# Patient Record
Sex: Female | Born: 1985 | Race: White | Hispanic: No | Marital: Married | Smoking: Former smoker
Health system: Southern US, Community
[De-identification: ages and names within clinical notes are randomized; demographics above are authoritative.]

## PROBLEM LIST (undated history)

## (undated) DIAGNOSIS — R011 Cardiac murmur, unspecified: Secondary | ICD-10-CM

## (undated) DIAGNOSIS — K219 Gastro-esophageal reflux disease without esophagitis: Secondary | ICD-10-CM

## (undated) HISTORY — PX: NO PAST SURGERIES: SHX2092

---

## 2012-05-17 ENCOUNTER — Observation Stay: Payer: Self-pay | Admitting: Obstetrics & Gynecology

## 2012-06-08 ENCOUNTER — Inpatient Hospital Stay: Payer: Self-pay | Admitting: Obstetrics and Gynecology

## 2012-06-08 LAB — PIH PROFILE
Anion Gap: 9 (ref 7–16)
Calcium, Total: 8.8 mg/dL (ref 8.5–10.1)
Chloride: 106 mmol/L (ref 98–107)
Co2: 23 mmol/L (ref 21–32)
EGFR (African American): >60
HCT: 35.6 % (ref 35.0–47.0)
HGB: 11.8 g/dL — ABNORMAL LOW (ref 12.0–16.0)
MCHC: 33 g/dL (ref 32.0–36.0)
MCV: 89 fL (ref 80–100)
Osmolality: 274 (ref 275–301)
Platelet: 158 10*3/uL (ref 150–440)
Potassium: 4.6 mmol/L (ref 3.5–5.1)
RDW: 15.2 % — ABNORMAL HIGH (ref 11.5–14.5)
SGOT(AST): 36 U/L (ref 15–37)
Sodium: 138 mmol/L (ref 136–145)
WBC: 12 10*3/uL — ABNORMAL HIGH (ref 3.6–11.0)

## 2012-06-08 LAB — PROTEIN / CREATININE RATIO, URINE: Protein/Creat. Ratio: 226 mg/gCREAT — ABNORMAL HIGH (ref 0–200)

## 2012-06-09 LAB — HEMATOCRIT: HCT: 31 % — ABNORMAL LOW (ref 35.0–47.0)

## 2015-03-02 NOTE — H&P (Signed)
L&D Evaluation:  History Expanded:   HPI 29 yo G1P0 w estimated date of confinement 06/04/12 for leakage of fluid and abd pain.  Mild irreg contractions.  No vb. Prenatal Care at [facility].    Gravida 1    Term 0    PreTerm 0    Abortion 0    Living 0    Presents with abdominal pain    Patient's Medical History No Chronic Illness    Patient's Surgical History none    Medications Pre Natal Vitamins    Allergies NKDA    Social History none    Family History Non-Contributory   ROS:   ROS All systems were reviewed.  HEENT, CNS, GI, GU, Respiratory, CV, Renal and Musculoskeletal systems were found to be normal.   Exam:   Vital Signs stable    General no apparent distress    Mental Status clear    Chest clear    Heart normal sinus rhythm, no murmur/gallop/rubs    Abdomen gravid, non-tender    Estimated Fetal Weight Average for gestational age    Back no CVAT    Edema no edema    Pelvic no external lesions, FT/50/-3 high and ballotable    Mebranes Intact, Neg Nitrazine    FHT normal rate with no decels    Ucx irregular    Skin dry   Impression:   Impression Abd pain.  NO premature rupture of membranes.  No cervical change so not in labor yet.   Plan:   Plan EFM/NST, monitor contractions and for cervical change, fluids   Electronic Signatures: Letitia LibraHarris, Zan Triska Paul (MD)  (Signed 26-Jul-13 11:59)  Authored: L&D Evaluation   Last Updated: 26-Jul-13 11:59 by Letitia LibraHarris, Jovaughn Wojtaszek Paul (MD)

## 2015-03-02 NOTE — H&P (Signed)
L&D Evaluation:  History:   HPI 29 yo G1P0 [facility] for [facility] today presenting with contractions    Presents with abdominal pain    Patient's Medical History No Chronic Illness    Patient's Surgical History none    Medications Pre Natal Vitamins    Allergies NKDA    Social History none    Family History Non-Contributory   ROS:   ROS All systems were reviewed.  HEENT, CNS, GI, GU, Respiratory, CV, Renal and Musculoskeletal systems were found to be normal.   Exam:   Vital Signs BP >140/90  165/60    General no apparent distress    Abdomen gravid, non-tender    Estimated Fetal Weight Average for gestational age    Fetal Position vtx    Edema 2+    Pelvic no external lesions, 4/70/-3    Mebranes Intact    FHT normal rate with no decels    Ucx irregular    Skin dry   Impression:   Impression early labor, evaluation for PIH, IOL for today postdate, presents with contractions, cervical change since clinic this week, and elvated BP   Plan:   Plan EFM/NST, monitor contractions and for cervical change, monitor BP, PIH panel, fluids   Electronic Signatures: Lorrene ReidStaebler, Rozelia Catapano M (MD)  (Signed 17-Aug-13 04:59)  Authored: L&D Evaluation   Last Updated: 17-Aug-13 04:59 by Lorrene ReidStaebler, Chan Rosasco M (MD)

## 2016-10-11 LAB — HM PAP SMEAR

## 2017-01-02 ENCOUNTER — Ambulatory Visit (INDEPENDENT_AMBULATORY_CARE_PROVIDER_SITE_OTHER): Admitting: Obstetrics & Gynecology

## 2017-01-02 ENCOUNTER — Encounter: Payer: Self-pay | Admitting: Obstetrics & Gynecology

## 2017-01-02 VITALS — BP 130/98 | HR 74 | Wt 198.0 lb

## 2017-01-02 DIAGNOSIS — Z3490 Encounter for supervision of normal pregnancy, unspecified, unspecified trimester: Secondary | ICD-10-CM

## 2017-01-02 DIAGNOSIS — O99210 Obesity complicating pregnancy, unspecified trimester: Secondary | ICD-10-CM | POA: Insufficient documentation

## 2017-01-02 DIAGNOSIS — Z3A01 Less than 8 weeks gestation of pregnancy: Secondary | ICD-10-CM

## 2017-01-02 LAB — POCT URINE PREGNANCY: Preg Test, Ur: POSITIVE — AB

## 2017-01-02 MED ORDER — PROVIDA OB 20-20-1.25 MG PO CAPS: 1.0000 | ORAL_CAPSULE | Freq: Every day | ORAL | 11 refills | Status: AC

## 2017-01-02 NOTE — Addendum Note (Signed)
Addended by: Cornelius MorasPATTERSON, Kanye Depree D on: 01/02/2017 11:49 AM   Modules accepted: Orders

## 2017-01-02 NOTE — Patient Instructions (Signed)

## 2017-01-02 NOTE — Progress Notes (Signed)
01/02/2017   Chief Complaint: Missed period  History of Present Illness: Penny Jones is a 31 y.o. G2P1001 [redacted]w[redacted]d based on Patient's last menstrual period was 11/30/2016. with an Estimated Date of Delivery: 09/06/17, with the above CC. Preg complicated by none.  Her periods were: regular periods every 28 days She was using no method when she conceived.  She has Negative signs or symptoms of nausea/vomiting of pregnancy. She has Negative signs or symptoms of miscarriage or preterm labor She identifies Negative Zika risk factors for her and her partner On any different medications around the time she conceived/early pregnancy: No  History of varicella: Yes   ROS: A 12-point review of systems was performed and negative, except as stated in the above HPI.  OBGYN History: As per HPI. OB History  Gravida Para Term Preterm AB Living  2 1 1     1   SAB TAB Ectopic Multiple Live Births               # Outcome Date GA Lbr Len/2nd Weight Sex Delivery Anes PTL Lv  2 Current           1 Term 06/08/12 [redacted]w[redacted]d  7 lb 2.2 oz (3.239 kg) F Vag-Spont         Any issues with any prior pregnancies: no Any prior children are healthy, doing well, without any problems or issues: yes History of pap smears: Yes. Last pap smear 09/2016. Abnormal: no  History of STIs: No   Past Medical History: History reviewed. No pertinent past medical history.  Past Surgical History: History reviewed. No pertinent surgical history.  Family History:  History reviewed. No pertinent family history. She denies any female cancers, bleeding or blood clotting disorders.  She denies any history of mental retardation, birth defects or genetic disorders in her or the FOB's history  Social History:  Social History   Social History  . Marital status: Married    Spouse name: N/A  . Number of children: N/A  . Years of education: N/A   Occupational History  . Not on file.   Social History Main Topics  . Smoking status:  Never Smoker  . Smokeless tobacco: Never Used  . Alcohol use Yes  . Drug use: No  . Sexual activity: Yes    Birth control/ protection: None   Other Topics Concern  . Not on file   Social History Narrative  . No narrative on file   Any pets in the household: no  Allergy: Allergies not on file  Current Outpatient Medications:  Current Outpatient Prescriptions:  .  Prenat w/o A Vit-FeFum-FePo-FA (PROVIDA OB) 20-20-1.25 MG CAPS, Take 1 capsule by mouth daily., Disp: 30 capsule, Rfl: 11  Review of Systems  Constitutional: Negative for chills, fever and malaise/fatigue.  HENT: Negative for congestion, sinus pain and sore throat.   Eyes: Negative for blurred vision and pain.  Respiratory: Negative for cough and wheezing.   Cardiovascular: Negative for chest pain and leg swelling.  Gastrointestinal: Negative for abdominal pain, constipation, diarrhea, heartburn, nausea and vomiting.  Genitourinary: Negative for dysuria, frequency, hematuria and urgency.  Musculoskeletal: Negative for back pain, joint pain, myalgias and neck pain.  Skin: Negative for itching and rash.  Neurological: Negative for dizziness, tremors and weakness.  Endo/Heme/Allergies: Does not bruise/bleed easily.  Psychiatric/Behavioral: Negative for depression. The patient is not nervous/anxious and does not have insomnia.    Physical Exam:    BP (!) 130/98   Pulse 74   Wt  198 lb (89.8 kg)   LMP 11/30/2016  There is no height or weight on file to calculate BMI. Fundal height: not applicable FHTs: not yet  Constitutional: Well nourished, well developed female in no acute distress.  Neck:  Supple, normal appearance, and no thyromegaly  Cardiovascular: S1, S2 normal, no murmur, rub or gallop, regular rate and rhythm Respiratory:  Clear to auscultation bilateral. Normal respiratory effort Abdomen: positive bowel sounds and no masses, hernias; diffusely non tender to palpation, non distended Breasts: breasts  appear normal, no suspicious masses, no skin or nipple changes or axillary nodes. Neuro/Psych:  Normal mood and affect.  Skin:  Warm and dry.  Lymphatic:  No inguinal lymphadenopathy.   Pelvic exam: is not limited by body habitus EGBUS: within normal limits, Vagina: within normal limits and with no blood in the vault, Cervix: normal appearing cervix without discharge or lesions, closed/long/high, Uterus:  enlarged: slightly, and Adnexa:  normal adnexa  Assessment: Penny Jones is a 31 y.o. G2P1001 4564w5d based on Patient's last menstrual period was 11/30/2016. with an Estimated Date of Delivery: 09/06/17,  for prenatal care.  Plan: G2P1 4 weeks  Obesity risk factors discussed  PNV.  US nv.  Genetic testing to be discussed/offered nv.  The patient has not traveled to a [place] endemic area within the past 6 months, nor has she had unprotected sex with a partner who has travelled to a [place] endemic region within the past 6 months.The patient has been advised to notify us if these factors change any time during this current pregnancy, so adequate testing and monitoring can be initiated.  Problem list reviewed and updated.  Follow up in 4 weeks.  Annamarie MajorPaul Ashleen Demma, MD, Merlinda FrederickFACOG [facility], [facility] 01/02/2017  11:29 AM

## 2017-01-03 LAB — RPR+RH+ABO+RUB AB+AB SCR+CB...
ANTIBODY SCREEN: NEGATIVE
HEMOGLOBIN: 13 g/dL (ref 11.1–15.9)
HIV Screen 4th Generation wRfx: NONREACTIVE
Hematocrit: 38.6 % (ref 34.0–46.6)
Hepatitis B Surface Ag: NEGATIVE
MCH: 30.9 pg (ref 26.6–33.0)
MCHC: 33.7 g/dL (ref 31.5–35.7)
MCV: 92 fL (ref 79–97)
Platelets: 325 10*3/uL (ref 150–379)
RBC: 4.21 x10E6/uL (ref 3.77–5.28)
RDW: 13.6 % (ref 12.3–15.4)
RH TYPE: NEGATIVE
RPR Ser Ql: NONREACTIVE
RUBELLA: 2.39 {index} (ref >0.99)
Varicella zoster IgG: 2692 index (ref >165)
WBC: 7.9 10*3/uL (ref 3.4–10.8)

## 2017-01-04 LAB — URINE CULTURE: Organism ID, Bacteria: NO GROWTH

## 2017-01-04 LAB — GC/CHLAMYDIA PROBE AMP
Chlamydia trachomatis, NAA: NEGATIVE
NEISSERIA GONORRHOEAE BY PCR: NEGATIVE

## 2017-01-25 ENCOUNTER — Other Ambulatory Visit: Payer: Self-pay

## 2017-01-25 ENCOUNTER — Telehealth: Payer: Self-pay

## 2017-01-25 DIAGNOSIS — Z3490 Encounter for supervision of normal pregnancy, unspecified, unspecified trimester: Secondary | ICD-10-CM

## 2017-01-25 NOTE — Telephone Encounter (Signed)
Pt calling stated PH gave her a bag of samples of pnv with instructions to call c the one she likes best and a rx would be calling in.  Pt would like rx of Concept DHA sent in. [phone]

## 2017-01-26 MED ORDER — CONCEPT DHA 53.5-38-1 MG PO CAPS: 1.0000 | ORAL_CAPSULE | Freq: Every day | ORAL | 11 refills | Status: DC

## 2017-01-26 NOTE — Telephone Encounter (Signed)
Can you send this in to the pharmacy ?

## 2017-01-26 NOTE — Telephone Encounter (Signed)
Pt calling to check status of rx request from yesterday. States [facility] still doesn't have it.

## 2017-01-29 MED ORDER — CONCEPT DHA 53.5-38-1 MG PO CAPS: 1.0000 | ORAL_CAPSULE | Freq: Every day | ORAL | 11 refills | Status: AC

## 2017-01-29 NOTE — Telephone Encounter (Signed)
To [facility] it went

## 2017-01-29 NOTE — Addendum Note (Signed)
Addended by: Nadara Mustard on: 01/29/2017 02:55 PM   Modules accepted: Orders

## 2017-01-30 ENCOUNTER — Ambulatory Visit (INDEPENDENT_AMBULATORY_CARE_PROVIDER_SITE_OTHER): Admitting: Obstetrics & Gynecology

## 2017-01-30 ENCOUNTER — Ambulatory Visit (INDEPENDENT_AMBULATORY_CARE_PROVIDER_SITE_OTHER)

## 2017-01-30 VITALS — BP 100/70 | Wt 200.0 lb

## 2017-01-30 DIAGNOSIS — Z3A01 Less than 8 weeks gestation of pregnancy: Secondary | ICD-10-CM

## 2017-01-30 DIAGNOSIS — O99210 Obesity complicating pregnancy, unspecified trimester: Secondary | ICD-10-CM

## 2017-01-30 DIAGNOSIS — Z3687 Encounter for antenatal screening for uncertain dates: Secondary | ICD-10-CM | POA: Diagnosis not present

## 2017-01-30 DIAGNOSIS — Z3490 Encounter for supervision of normal pregnancy, unspecified, unspecified trimester: Secondary | ICD-10-CM

## 2017-01-30 NOTE — Progress Notes (Signed)
  Obstetric Problem Visit   Chief Complaint:  Chief Complaint  Patient presents with  . Routine Prenatal Visit    bleeding    History of Present Illness: Patient is a 31 y.o. G2P1001 [redacted]w[redacted]d presenting for first trimester bleeding.  The onset of bleeding was 9 days ago (light) then not again until today (brown).  Is bleeding equal to or greater than normal menstrual flow:  No Any recent trauma:  No Recent intercourse:  No History of prior miscarriage:  No Prior ultrasound demonstrating IUP:  Yes- today has GEST SAC but no fetal pole or yolk sac Prior ultrasound demonstrating viable IUP:  No Prior Serum HCG:  No  PMHx: She  has no past medical history on file. Also,  has no past surgical history on file., family history is not on file.,  reports that she has never smoked. She has never used smokeless tobacco. She reports that she drinks alcohol. She reports that she does not use drugs.  She has a current medication list which includes the following prescription(s): provida ob and concept dha. Also, has No Known Allergies.  ROS- negative except as above  Objective: Vitals:   01/30/17 1132  BP: 100/70   OBGyn Exam- deferred (see last visit)  Assessment: 31 y.o. G2P1001 [redacted]w[redacted]d 1. Obesity affecting pregnancy, antepartum  2. [redacted] weeks gestation of pregnancy Based on Gest Sac measurements today - Beta HCG, Quant today - US OB Comp Less 14 Wks; Future in 2 weeks (pending beta pattern) - Beta HCG, Quant; Future in 2 days  3. Small for gestational age - Beta HCG, Quant - US OB Comp Less 14 Wks; Future - Beta HCG, Quant; Future  1) First trimester bleeding - incidence and clinical course of first trimester bleeding is discussed in detail with the patient today.  Approximately 1/3 of pregnancies ending in live births experienced 1st trimester bleeding.  The amount of bleeding is variable and not necessarily predictive of outcome.  Sources may be cervical or uterine.  Subchorionic  hemorrhages are a frequent concurrent findings on ultrasound and are followed expectantly.  These often absorb or regress spontaneously although risk for expansion and further disruption of the utero-placental interface leading to miscarriage is possible.  There is no clearly documented benefit to limiting or modifying activity and sexual intercourse in altering clinic course of 1st trimester bleeding.    2) Routine bleeding precautions were discussed with the patient prior the conclusion of today's visit.  Annamarie Major, MD, [facility], [facility] 01/30/2017  12:00 PM

## 2017-01-31 ENCOUNTER — Telehealth: Payer: Self-pay | Admitting: Obstetrics & Gynecology

## 2017-01-31 LAB — BETA HCG QUANT (REF LAB): hCG Quant: 47364 m[IU]/mL

## 2017-01-31 NOTE — Telephone Encounter (Signed)
PT is calling to find out about her Labs result. Please advise [phone]

## 2017-01-31 NOTE — Progress Notes (Signed)
Hight level, so likely miscarriage and not mid-dated pregnancy.  Await second beta level in 48 hours.  LM with patient. Annamarie Major, MD, [facility] Frederick Ob/[facility], [facility] 01/31/2017  10:32 AM

## 2017-02-01 ENCOUNTER — Other Ambulatory Visit

## 2017-02-01 DIAGNOSIS — Z3A01 Less than 8 weeks gestation of pregnancy: Secondary | ICD-10-CM

## 2017-02-01 NOTE — Telephone Encounter (Signed)
please advise.

## 2017-02-02 ENCOUNTER — Ambulatory Visit (INDEPENDENT_AMBULATORY_CARE_PROVIDER_SITE_OTHER)

## 2017-02-02 ENCOUNTER — Ambulatory Visit (INDEPENDENT_AMBULATORY_CARE_PROVIDER_SITE_OTHER): Admitting: Obstetrics & Gynecology

## 2017-02-02 VITALS — BP 128/80 | Wt 202.0 lb

## 2017-02-02 DIAGNOSIS — O200 Threatened abortion: Secondary | ICD-10-CM

## 2017-02-02 DIAGNOSIS — O020 Blighted ovum and nonhydatidiform mole: Secondary | ICD-10-CM | POA: Diagnosis not present

## 2017-02-02 DIAGNOSIS — Z3A01 Less than 8 weeks gestation of pregnancy: Secondary | ICD-10-CM | POA: Diagnosis not present

## 2017-02-02 LAB — BETA HCG QUANT (REF LAB): hCG Quant: 51978 m[IU]/mL

## 2017-02-02 NOTE — Telephone Encounter (Signed)
Pt is schedule 02/02/17 with Tiburcio Pea

## 2017-02-02 NOTE — Telephone Encounter (Signed)
Will call w results

## 2017-02-02 NOTE — Telephone Encounter (Signed)
-----   Message from Nadara Mustard, MD sent at 02/02/2017  8:00 AM EDT ----- Add to 1030 OB US appt slot and then me to follow [facility]).  Pt knows to be here 1020.

## 2017-02-02 NOTE — Progress Notes (Signed)
  HPI: Threatened Abortion Patient at 65 and 1/[redacted] weeks gestation by LMP.  Patient reports one episode of dark blood Wed but no other bleeding; +breast T and nausea since Tues.  She is not in acute distress.  Ectopic risks include none. Cycle length is regular. Pregnancy testing: Beta 47,000 then 51,000. Pregnancy imaging: transvaginal ultrasound done on today. Result Gest sac increased in size c/w 7 weeks dating, no CRL or yolk sac.  Smaller SCH.Marland Kitchen  Blood type: A negative.  Other lab results: none.  Ultrasound demonstrates see above These findings are Pelvis abnormal blighted ovum vs mniscarriage vs early unseen pregnancy   PMHx: She  has no past medical history on file. Also,  has no past surgical history on file., family history is not on file.,  reports that she has never smoked. She has never used smokeless tobacco. She reports that she drinks alcohol. She reports that she does not use drugs.  She has a current medication list which includes the following prescription(s): concept dha. Also, has No Known Allergies.  Review of Systems  Constitutional: Negative for chills, fever and malaise/fatigue.  HENT: Negative for congestion, sinus pain and sore throat.   Eyes: Negative for blurred vision and pain.  Respiratory: Negative for cough and wheezing.   Cardiovascular: Negative for chest pain and leg swelling.  Gastrointestinal: Negative for abdominal pain, constipation, diarrhea, heartburn, nausea and vomiting.  Genitourinary: Negative for dysuria, frequency, hematuria and urgency.  Musculoskeletal: Negative for back pain, joint pain, myalgias and neck pain.  Skin: Negative for itching and rash.  Neurological: Negative for dizziness, tremors and weakness.  Endo/Heme/Allergies: Does not bruise/bleed easily.  Psychiatric/Behavioral: Negative for depression. The patient is not nervous/anxious and does not have insomnia.     Objective: BP 128/80   Wt 202 lb (91.6 kg)   LMP 11/30/2016    Physical examination Constitutional NAD, Conversant  Skin No rashes, lesions or ulceration.   Extremities: Moves all appropriately.  Normal ROM for age. No lymphadenopathy.  Neuro: Grossly intact  Psych: Oriented to PPT.  Normal mood. Normal affect.   Assessment:  Blighted ovum  [redacted] weeks gestation of pregnancy  Threatened abortion  Monitor sx's. Repeat US as scheduled, sooner if worsening bleeding or pain. Exp mgt vs D&C disucssed  Annamarie Major, MD, Merlinda Frederick Ob/Gyn, [facility] 02/02/2017  11:02 AM

## 2017-02-12 ENCOUNTER — Encounter: Admitting: Obstetrics & Gynecology

## 2017-02-12 ENCOUNTER — Other Ambulatory Visit: Payer: Self-pay | Admitting: Advanced Practice Midwife

## 2017-02-12 DIAGNOSIS — O021 Missed abortion: Secondary | ICD-10-CM

## 2017-02-13 ENCOUNTER — Ambulatory Visit (INDEPENDENT_AMBULATORY_CARE_PROVIDER_SITE_OTHER)

## 2017-02-13 ENCOUNTER — Ambulatory Visit (INDEPENDENT_AMBULATORY_CARE_PROVIDER_SITE_OTHER): Admitting: Advanced Practice Midwife

## 2017-02-13 VITALS — BP 118/78 | Wt 203.0 lb

## 2017-02-13 DIAGNOSIS — O200 Threatened abortion: Secondary | ICD-10-CM

## 2017-02-13 DIAGNOSIS — O021 Missed abortion: Secondary | ICD-10-CM | POA: Diagnosis not present

## 2017-02-13 DIAGNOSIS — O0289 Other abnormal products of conception: Secondary | ICD-10-CM

## 2017-02-13 NOTE — Progress Notes (Signed)
u/s done today

## 2017-02-14 LAB — BETA HCG QUANT (REF LAB): hCG Quant: 23877 m[IU]/mL

## 2017-02-14 NOTE — Patient Instructions (Signed)

## 2017-02-14 NOTE — Progress Notes (Addendum)
Had dating scan done today. No fetal pole and no cardiac activity. Beta Hcg done and is trending down from 47K to 51K last week and down to 23K today. Discussion of options for treatment including expectant management, medication, D&C. Pt is uncertain at this time. She plans to consider the options and let us know. Pt is reminded to have Rhogam injection.

## 2017-02-26 ENCOUNTER — Telehealth: Payer: Self-pay

## 2017-02-26 NOTE — Telephone Encounter (Signed)
Pt calling PH to let him know what she has decided.   Please call [phone]

## 2017-02-26 NOTE — Telephone Encounter (Signed)
Patient would like to speak to Dr. Tiburcio PeaHarris regarding miscarriage. States she has not had any bleeding or cramping. She does not want to have a D&C but would like advice on how long to wait to see if anything happens naturally or when to start medication. Please advise. cb# [phone] thank you

## 2017-02-28 ENCOUNTER — Ambulatory Visit: Admitting: Obstetrics & Gynecology

## 2017-02-28 ENCOUNTER — Encounter: Payer: Self-pay | Admitting: Obstetrics & Gynecology

## 2017-02-28 ENCOUNTER — Encounter
Admission: RE | Admit: 2017-02-28 | Discharge: 2017-02-28 | Disposition: A | Discharge status: Home / Self Care | Source: Ambulatory Visit | Attending: Obstetrics & Gynecology | Admitting: Obstetrics & Gynecology

## 2017-02-28 ENCOUNTER — Ambulatory Visit (INDEPENDENT_AMBULATORY_CARE_PROVIDER_SITE_OTHER): Admitting: Obstetrics & Gynecology

## 2017-02-28 VITALS — BP 118/80 | HR 80 | Ht 62.0 in | Wt 203.0 lb

## 2017-02-28 DIAGNOSIS — O021 Missed abortion: Secondary | ICD-10-CM

## 2017-02-28 HISTORY — DX: Cardiac murmur, unspecified: R01.1

## 2017-02-28 HISTORY — DX: Gastro-esophageal reflux disease without esophagitis: K21.9

## 2017-02-28 LAB — BETA HCG QUANT (REF LAB): HCG QUANT: 2784 m[IU]/mL

## 2017-02-28 NOTE — Telephone Encounter (Signed)
Pt calling again today to discuss with RPH what she has decided to do and if she wants to do the procedure was told she would need an appt. Pt would [facility] to [facility] if possible but knows that she would need to fill out paperwork and be seen for H&P. Pt is very nervous and anxious about having surgery but also wants to resolve miscarriage. Please advise if you would like to see patient this afternoon if your schedule allows. Thank you.

## 2017-02-28 NOTE — Progress Notes (Signed)
Obstetric Problem Visit   Chief Complaint:  Miscarriage  History of Present Illness: Patient is a 31 y.o. G2P1001 1657w6d presenting for first trimester bleeding and pain that has somewhat resolved; yet has beta hCG levels and US findings c/w missed abortion that has not passed.  Is bleeding equal to or greater than normal menstrual flow:  No Any recent trauma:  No Recent intercourse:  No History of prior miscarriage:  No Prior ultrasound demonstrating IUP:  Yes Prior ultrasound demonstrating viable IUP:  No Prior Serum HCG:  Yes Rh status: NEG  PMHx: She  has no past medical history on file. Also,  has no past surgical history on file., family history is not on file.,  reports that she has never smoked. She has never used smokeless tobacco. She reports that she drinks alcohol. She reports that she does not use drugs.  She has a current medication list which includes the following prescription(s): concept dha. Also, has No Known Allergies.  Review of Systems  Constitutional: Negative for chills, fever and malaise/fatigue.  HENT: Negative for congestion, sinus pain and sore throat.   Eyes: Negative for blurred vision and pain.  Respiratory: Negative for cough and wheezing.   Cardiovascular: Negative for chest pain and leg swelling.  Gastrointestinal: Negative for abdominal pain, constipation, diarrhea, heartburn, nausea and vomiting.  Genitourinary: Negative for dysuria, frequency, hematuria and urgency.  Musculoskeletal: Negative for back pain, joint pain, myalgias and neck pain.  Skin: Negative for itching and rash.  Neurological: Negative for dizziness, tremors and weakness.  Endo/Heme/Allergies: Does not bruise/bleed easily.  Psychiatric/Behavioral: Negative for depression. The patient is not nervous/anxious and does not have insomnia.     Objective: Vitals:   02/28/17 1302  BP: 118/80  Pulse: 80   Physical Exam  Constitutional: She is oriented to person, place, and time.  She appears well-developed and well-nourished. No distress.  Genitourinary: Rectum normal, vagina normal and uterus normal. Pelvic exam was performed with patient supine. There is no rash or lesion on the right labia. There is no rash or lesion on the left labia. Vagina exhibits no lesion. No bleeding in the vagina. Right adnexum does not display mass and does not display tenderness. Left adnexum does not display mass and does not display tenderness. Cervix does not exhibit motion tenderness, lesion, friability or polyp.   Uterus is mobile and midaxial. Uterus is not enlarged or exhibiting a mass.  HENT:  Head: Normocephalic and atraumatic. Head is without laceration.  Right Ear: Hearing normal.  Left Ear: Hearing normal.  Nose: No epistaxis.  No foreign bodies.  Mouth/Throat: Uvula is midline, oropharynx is clear and moist and mucous membranes are normal.  Eyes: Pupils are equal, round, and reactive to light.  Neck: Normal range of motion. Neck supple. No thyromegaly present.  Cardiovascular: Normal rate and regular rhythm.  Exam reveals no gallop and no friction rub.   No murmur heard. Pulmonary/Chest: Effort normal and breath sounds normal. No respiratory distress. She has no wheezes. Right breast exhibits no mass, no skin change and no tenderness. Left breast exhibits no mass, no skin change and no tenderness.  Abdominal: Soft. Bowel sounds are normal. She exhibits no distension. There is no tenderness. There is no rebound.  Musculoskeletal: Normal range of motion.  Neurological: She is alert and oriented to person, place, and time. No cranial nerve deficit.  Skin: Skin is warm and dry.  Psychiatric: She has a normal mood and affect. Judgment normal.  Vitals reviewed.  Assessment: 31 y.o. G2P1001 [redacted]w[redacted]d 1. Missed abortion - Beta HCG, Quant -D&C as it has been a few weeks without spontaneous resolution  Plan: Problem List Items Addressed This Visit    None    Visit Diagnoses     Missed abortion    -  Primary   Relevant Orders   Beta HCG, Quant      1) Plan D&C due to lack of spontaneous resolution  2) I have had a careful discussion with this patient about all the options available and the risk/benefits of each. I have fully informed this patient that surgery may subject her to a variety of discomforts and risks: She understands that most patients have surgery with little difficulty, but problems can happen ranging from minor to fatal. These include nausea, vomiting, pain, bleeding, infection, poor healing, hernia, or formation of adhesions. Unexpected reactions may occur from any drug or anesthetic given. Unintended injury may occur to other pelvic or abdominal structures such as Fallopian tubes, ovaries, bladder, ureter (tube from kidney to bladder), or bowel. Nerves going from the pelvis to the legs may be injured. Any such injury may require immediate or later additional surgery to correct the problem. Excessive blood loss requiring transfusion is very unlikely but possible. Dangerous blood clots may form in the legs or lungs. Physical and sexual activity will be restricted in varying degrees for an indeterminate period of time but most often 2-6 weeks.  Finally, she understands that it is impossible to list every possible undesirable effect and that the condition for which surgery is done is not always cured or significantly improved, and in rare cases may be even worse.Ample time was given to answer all questions.  3) The patient is Rh NEG, rhogam is therefore  indicated to decrease the risk rhesus alloimmunization.  Will give in hospital.  4) Routine bleeding precautions were discussed with the patient prior the conclusion of today's visit.  Annamarie Major, MD, [facility] Frederick Ob/G[facility], [facility] 02/28/2017  1:26 PM

## 2017-02-28 NOTE — Telephone Encounter (Signed)
Sch appt today 1:00 for me to preop.  Then forward this to Cohen to schedule surgery.  Surgery Booking Request Patient Full Name: Penny Jones  MRN: [id number]  DOB: 1986/07/08  Surgeon: Letitia Libraobert Paul Harris, MD  Requested Surgery Date and Time: 03/02/17 Primary Diagnosis and Code: Missed Abortion Secondary Diagnosis and Code: none Surgical Procedure: Suction D&C L&D Notification: No Admission Status: same day surgery Length of Surgery: 20 Special Case Needs: no H&P: 5/9 (date) Phone Interview or Office Pre-Admit: yes Interpreter: no Language: e Medical Clearance: no Special Scheduling Instructions: no

## 2017-02-28 NOTE — Patient Instructions (Signed)
  Your procedure is scheduled on: 03-01-17 Report to Same Day Surgery 2nd floor medical mall [facility](Medical Mall Entrance-take elevator on left to 2nd floor.  Check in with surgery information desk.) @ 12:30 PM   Remember: Instructions that are not followed completely may result in serious medical risk, up to and including death, or upon the discretion of your surgeon and anesthesiologist your surgery may need to be rescheduled.    _x___ 1. Do not eat food or drink liquids after midnight. No gum chewing or                              hard candies.     __x__ 2. No Alcohol for 24 hours before or after surgery.   __x__3. No Smoking for 24 prior to surgery.   ____  4. Bring all medications with you on the day of surgery if instructed.    __x__ 5. Notify your doctor if there is any change in your medical condition     (cold, fever, infections).     Do not wear jewelry, make-up, hairpins, clips or nail polish.  Do not wear lotions, powders, or perfumes. You may wear deodorant.  Do not shave 48 hours prior to surgery. Men may shave face and neck.  Do not bring valuables to the hospital.    [facility] belongings or valuables.               Contacts, dentures or bridgework may not be worn into surgery.  Leave your suitcase in the car. After surgery it may be brought to your room.  For patients admitted to the hospital, discharge time is determined by your                       treatment team.   Patients discharged the day of surgery will not be allowed to drive home.  You will need someone to drive you home and stay with you the night of your procedure.    Please read over the following fact sheets that you were given:   [facility] and or MRSA Information   ____ Take anti-hypertensive (unless it includes a diuretic), cardiac, seizure, asthma,     anti-reflux and psychiatric medicines. These include:  1. NONE  2.  3.  4.  5.  6.  ____Fleets  enema or Magnesium Citrate as directed.   ____ Use CHG Soap or sage wipes as directed on instruction sheet   ____ Use inhalers on the day of surgery and bring to hospital day of surgery  ____ Stop Metformin and Janumet 2 days prior to surgery.    ____ Take 1/2 of usual insulin dose the night before surgery and none on the morning     surgery.   ____ Follow recommendations from Cardiologist, Pulmonologist or PCP regarding          stopping Aspirin, Coumadin, Pllavix ,Eliquis, Effient, or Pradaxa, and Pletal.  X____Stop Anti-inflammatories such as Advil, Aleve, Ibuprofen, Motrin, Naproxen, Naprosyn, Goodies powders or aspirin products NOWOK to take Tylenol   ____ Stop supplements until after surgery.  But may continue Vitamin D, Vitamin B,       and multivitamin.   ____ Bring C-Pap to the hospital.

## 2017-02-28 NOTE — Telephone Encounter (Signed)
Pt aware of appt for this afternoon.   Harriett SineNancy please see below for surgery scheduling. Thank you.

## 2017-02-28 NOTE — Patient Instructions (Signed)
Dilation and Curettage or Vacuum Curettage Dilation and curettage (D&C) and vacuum curettage are minor procedures. A D&C involves stretching (dilation) the cervix and scraping (curettage) the inside lining of the uterus (endometrium). During a D&C, tissue is gently scraped from the endometrium, starting from the top portion of the uterus down to the lowest part of the uterus (cervix). During a vacuum curettage, the lining and tissue in the uterus are removed with the use of gentle suction. Curettage may be performed to either diagnose or treat a problem. As a diagnostic procedure, curettage is performed to examine tissues from the uterus. A diagnostic curettage may be done if you have:  Irregular bleeding in the uterus.  Bleeding with the development of clots.  Spotting between menstrual periods.  Prolonged menstrual periods or other abnormal bleeding.  Bleeding after menopause.  No menstrual period (amenorrhea).  A change in size and shape of the uterus.  Abnormal endometrial cells discovered during a Pap test. As a treatment procedure, curettage may be performed for the following reasons:  Removal of an IUD (intrauterine device).  Removal of retained placenta after giving birth.  Abortion.  Miscarriage.  Removal of endometrial polyps.  Removal of uncommon types of noncancerous lumps (fibroids). Tell a health care provider about:  Any allergies you have, including allergies to prescribed medicine or latex.  All medicines you are taking, including vitamins, herbs, eye drops, creams, and over-the-counter medicines. This is especially important if you take any blood-thinning medicine. Bring a list of all of your medicines to your appointment.  Any problems you or family members have had with anesthetic medicines.  Any blood disorders you have.  Any surgeries you have had.  Your medical history and any medical conditions you have.  Whether you are pregnant or may be  pregnant.  Recent vaginal infections you have had.  Recent menstrual periods, bleeding problems you have had, and what form of birth control (contraception) you use. What are the risks? Generally, this is a safe procedure. However, problems may occur, including:  Infection.  Heavy vaginal bleeding.  Allergic reactions to medicines.  Damage to the cervix or other structures or organs.  Development of scar tissue (adhesions) inside the uterus, which can cause abnormal amounts of menstrual bleeding. This may make it harder to get pregnant in the future.  A hole (perforation) or puncture in the uterine wall. This is rare. What happens before the procedure? Staying hydrated  Follow instructions from your health care provider about hydration, which may include:  Up to 2 hours before the procedure - you may continue to drink clear liquids, such as water, clear fruit juice, black coffee, and plain tea. Eating and drinking restrictions  Follow instructions from your health care provider about eating and drinking, which may include:  8 hours before the procedure - stop eating heavy meals or foods such as meat, fried foods, or fatty foods.  6 hours before the procedure - stop eating light meals or foods, such as toast or cereal.  6 hours before the procedure - stop drinking milk or drinks that contain milk.  2 hours before the procedure - stop drinking clear liquids. If your health care provider told you to take your medicine(s) on the day of your procedure, take them with only a sip of water. Medicines   Ask your health care provider about:  Changing or stopping your regular medicines. This is especially important if you are taking diabetes medicines or blood thinners.  Taking medicines such   as aspirin and ibuprofen. These medicines can thin your blood. Do not take these medicines before your procedure if your health care provider instructs you not to.  You may be given antibiotic  medicine to help prevent infection. General instructions   For 24 hours before your procedure, do not:  Douche.  Use tampons.  Use medicines, creams, or suppositories in the vagina.  Have sexual intercourse.  You may be given a pregnancy test on the day of the procedure.  Plan to have someone take you home from the hospital or clinic.  You may have a blood or urine sample taken.  If you will be going home right after the procedure, plan to have someone with you for 24 hours. What happens during the procedure?  To reduce your risk of infection:  Your health care team will wash or sanitize their hands.  Your skin will be washed with soap.  An IV tube will be inserted into one of your veins.  You will be given one of the following:  A medicine that numbs the area in and around the cervix (local anesthetic).  A medicine to make you fall asleep (general anesthetic).  You will lie down on your back, with your feet in foot rests (stirrups).  The size and position of your uterus will be checked.  A lubricated instrument (speculum or Sims retractor) will be inserted into the back side of your vagina. The speculum will be used to hold apart the walls of your vagina so your health care provider can see your cervix.  A tool (tenaculum) will be attached to the lip of the cervix to stabilize it.  Your cervix will be softened and dilated. This may be done by:  Taking a medicine.  Having tapered dilators or thin rods (laminaria) or gradual widening instruments (tapered dilators) inserted into your cervix.  A small, sharp, curved instrument (curette) will be used to scrape a small amount of tissue or cells from the endometrium or cervical canal. In some cases, gentle suction is applied with the curette. The curette will then be removed. The cells will be taken to a lab for testing. The procedure may vary among health care providers and hospitals. What happens after the  procedure?  You may have mild cramping, backache, pain, and light bleeding or spotting. You may pass small blood clots from your vagina.  You may have to wear compression stockings. These stockings help to prevent blood clots and reduce swelling in your legs.  Your blood pressure, heart rate, breathing rate, and blood oxygen level will be monitored until the medicines you were given have worn off. Summary  Dilation and curettage (D&C) involves stretching (dilation) the cervix and scraping (curettage) the inside lining of the uterus (endometrium).  After the procedure, you may have mild cramping, backache, pain, and light bleeding or spotting. You may pass small blood clots from your vagina.  Plan to have someone take you home from the hospital or clinic. This information is not intended to replace advice given to you by your health care provider. Make sure you discuss any questions you have with your health care provider. Document Released: 10/09/2005 Document Revised: 06/25/2016 Document Reviewed: 06/25/2016 Elsevier Interactive Patient Education  2017 Elsevier Inc.  

## 2017-03-01 ENCOUNTER — Ambulatory Visit: Admitting: Anesthesiology

## 2017-03-01 ENCOUNTER — Encounter
Admission: RE | Disposition: A | Discharge status: Home / Self Care | Payer: Self-pay | Source: Ambulatory Visit | Attending: Obstetrics & Gynecology

## 2017-03-01 ENCOUNTER — Inpatient Hospital Stay: Admission: RE | Admit: 2017-03-01 | Source: Ambulatory Visit

## 2017-03-01 ENCOUNTER — Encounter: Payer: Self-pay | Admitting: Anesthesiology

## 2017-03-01 ENCOUNTER — Ambulatory Visit
Admission: RE | Admit: 2017-03-01 | Discharge: 2017-03-01 | Disposition: A | Discharge status: Home / Self Care | Source: Ambulatory Visit | Attending: Obstetrics & Gynecology | Admitting: Obstetrics & Gynecology

## 2017-03-01 DIAGNOSIS — O021 Missed abortion: Secondary | ICD-10-CM | POA: Diagnosis present

## 2017-03-01 DIAGNOSIS — Z87891 Personal history of nicotine dependence: Secondary | ICD-10-CM | POA: Diagnosis not present

## 2017-03-01 DIAGNOSIS — K219 Gastro-esophageal reflux disease without esophagitis: Secondary | ICD-10-CM | POA: Diagnosis not present

## 2017-03-01 HISTORY — PX: DILATION AND EVACUATION: SHX1459

## 2017-03-01 LAB — TYPE AND SCREEN
ABO/RH(D): A NEG
ANTIBODY SCREEN: NEGATIVE

## 2017-03-01 LAB — CBC
HEMATOCRIT: 36.8 % (ref 35.0–47.0)
HEMOGLOBIN: 12.6 g/dL (ref 12.0–16.0)
MCH: 30.6 pg (ref 26.0–34.0)
MCHC: 34.2 g/dL (ref 32.0–36.0)
MCV: 89.5 fL (ref 80.0–100.0)
Platelets: 240 10*3/uL (ref 150–440)
RBC: 4.11 MIL/uL (ref 3.80–5.20)
RDW: 13 % (ref 11.5–14.5)
WBC: 7.7 10*3/uL (ref 3.6–11.0)

## 2017-03-01 LAB — ABO/RH: ABO/RH(D): A NEG

## 2017-03-01 SURGERY — DILATION AND EVACUATION, UTERUS
Anesthesia: General | Site: Vagina | Wound class: Clean Contaminated

## 2017-03-01 MED ORDER — ACETAMINOPHEN NICU IV SYRINGE 10 MG/ML
INTRAVENOUS | Status: AC
  Filled 2017-03-01: qty 1

## 2017-03-01 MED ORDER — ONDANSETRON HCL 4 MG/2ML IJ SOLN: 4.0000 mg | Freq: Once | INTRAMUSCULAR | Status: DC | PRN

## 2017-03-01 MED ORDER — RHO D IMMUNE GLOBULIN 1500 UNIT/2ML IJ SOSY
300.0000 ug | PREFILLED_SYRINGE | Freq: Once | INTRAMUSCULAR | Status: AC
  Administered 2017-03-01: 300 ug via INTRAMUSCULAR
  Filled 2017-03-01: qty 2

## 2017-03-01 MED ORDER — KETOROLAC TROMETHAMINE 30 MG/ML IJ SOLN: 30.0000 mg | Freq: Four times a day (QID) | INTRAMUSCULAR | Status: DC

## 2017-03-01 MED ORDER — MORPHINE SULFATE (PF) 4 MG/ML IV SOLN: 1.0000 mg | INTRAVENOUS | Status: DC | PRN

## 2017-03-01 MED ORDER — MIDAZOLAM HCL 2 MG/2ML IJ SOLN
INTRAMUSCULAR | Status: DC | PRN
  Administered 2017-03-01: 2 mg via INTRAVENOUS

## 2017-03-01 MED ORDER — FENTANYL CITRATE (PF) 100 MCG/2ML IJ SOLN
INTRAMUSCULAR | Status: DC | PRN
  Administered 2017-03-01: 50 ug via INTRAVENOUS

## 2017-03-01 MED ORDER — ONDANSETRON HCL 4 MG/2ML IJ SOLN
INTRAMUSCULAR | Status: AC
  Filled 2017-03-01: qty 4

## 2017-03-01 MED ORDER — DOXYCYCLINE HYCLATE 100 MG PO CAPS: 100.0000 mg | ORAL_CAPSULE | Freq: Two times a day (BID) | ORAL | 0 refills | Status: DC

## 2017-03-01 MED ORDER — FENTANYL CITRATE (PF) 100 MCG/2ML IJ SOLN
INTRAMUSCULAR | Status: AC
  Filled 2017-03-01: qty 2

## 2017-03-01 MED ORDER — FAMOTIDINE 20 MG PO TABS
20.0000 mg | ORAL_TABLET | Freq: Once | ORAL | Status: AC
  Administered 2017-03-01: 20 mg via ORAL

## 2017-03-01 MED ORDER — LACTATED RINGERS IV SOLN
INTRAVENOUS | Status: DC
  Administered 2017-03-01: 12:00:00 via INTRAVENOUS

## 2017-03-01 MED ORDER — GLYCOPYRROLATE 0.2 MG/ML IJ SOLN
INTRAMUSCULAR | Status: AC
  Filled 2017-03-01: qty 5

## 2017-03-01 MED ORDER — ONDANSETRON HCL 4 MG/2ML IJ SOLN
INTRAMUSCULAR | Status: DC | PRN
  Administered 2017-03-01: 4 mg via INTRAVENOUS

## 2017-03-01 MED ORDER — LIDOCAINE HCL (CARDIAC) 20 MG/ML IV SOLN
INTRAVENOUS | Status: DC | PRN
  Administered 2017-03-01: 100 mg via INTRAVENOUS

## 2017-03-01 MED ORDER — PROPOFOL 10 MG/ML IV BOLUS
INTRAVENOUS | Status: DC | PRN
  Administered 2017-03-01: 30 mg via INTRAVENOUS
  Administered 2017-03-01: 80 mg via INTRAVENOUS
  Administered 2017-03-01: 50 mg via INTRAVENOUS
  Administered 2017-03-01: 150 mg via INTRAVENOUS
  Administered 2017-03-01: 50 mg via INTRAVENOUS

## 2017-03-01 MED ORDER — METHYLERGONOVINE MALEATE 0.2 MG PO TABS: 0.2000 mg | ORAL_TABLET | Freq: Four times a day (QID) | ORAL | 0 refills | Status: DC

## 2017-03-01 MED ORDER — DEXAMETHASONE SODIUM PHOSPHATE 10 MG/ML IJ SOLN
INTRAMUSCULAR | Status: AC
  Filled 2017-03-01: qty 2

## 2017-03-01 MED ORDER — IBUPROFEN 400 MG PO TABS: 400.0000 mg | ORAL_TABLET | Freq: Four times a day (QID) | ORAL | 0 refills | Status: DC | PRN

## 2017-03-01 MED ORDER — FENTANYL CITRATE (PF) 100 MCG/2ML IJ SOLN: 25.0000 ug | INTRAMUSCULAR | Status: DC | PRN

## 2017-03-01 MED ORDER — PROPOFOL 10 MG/ML IV BOLUS
INTRAVENOUS | Status: AC
  Filled 2017-03-01: qty 20

## 2017-03-01 MED ORDER — ACETAMINOPHEN 325 MG PO TABS: 650.0000 mg | ORAL_TABLET | ORAL | Status: DC | PRN

## 2017-03-01 MED ORDER — ACETAMINOPHEN 650 MG RE SUPP: 650.0000 mg | RECTAL | Status: DC | PRN

## 2017-03-01 MED ORDER — MIDAZOLAM HCL 2 MG/2ML IJ SOLN
INTRAMUSCULAR | Status: AC
  Filled 2017-03-01: qty 2

## 2017-03-01 MED ORDER — FAMOTIDINE 20 MG PO TABS
ORAL_TABLET | ORAL | Status: AC
  Administered 2017-03-01: 20 mg via ORAL
  Filled 2017-03-01: qty 1

## 2017-03-01 SURGICAL SUPPLY — 22 items
BAG COUNTER SPONGE EZ (MISCELLANEOUS) ×2 IMPLANT
CANISTER SUC SOCK COL 7IN (MISCELLANEOUS) ×3 IMPLANT
CATH ROBINSON RED A/P 16FR (CATHETERS) ×3 IMPLANT
COUNTER SPONGE BAG EZ (MISCELLANEOUS) ×1
FILTER UTR ASPR SPEC (MISCELLANEOUS) ×1 IMPLANT
FLTR UTR ASPR SPEC (MISCELLANEOUS) ×3
GLOVE BIO SURGEON STRL SZ8 (GLOVE) ×3 IMPLANT
GOWN STRL REUS W/ TWL LRG LVL3 (GOWN DISPOSABLE) ×1 IMPLANT
GOWN STRL REUS W/ TWL XL LVL3 (GOWN DISPOSABLE) ×1 IMPLANT
GOWN STRL REUS W/TWL LRG LVL3 (GOWN DISPOSABLE) ×2
GOWN STRL REUS W/TWL XL LVL3 (GOWN DISPOSABLE) ×2
KIT BERKELEY 1ST TRIMESTER 3/8 (MISCELLANEOUS) ×3 IMPLANT
KIT RM TURNOVER CYSTO AR (KITS) ×3 IMPLANT
NS IRRIG 500ML POUR BTL (IV SOLUTION) ×3 IMPLANT
PACK DNC HYST (MISCELLANEOUS) ×3 IMPLANT
PAD OB MATERNITY 4.3X12.25 (PERSONAL CARE ITEMS) ×3 IMPLANT
PAD PREP 24X41 OB/GYN DISP (PERSONAL CARE ITEMS) ×3 IMPLANT
SET BERKELEY SUCTION TUBING (SUCTIONS) ×3 IMPLANT
TOWEL OR 17X26 4PK STRL BLUE (TOWEL DISPOSABLE) ×3 IMPLANT
VACURETTE 10 RIGID CVD (CANNULA) IMPLANT
VACURETTE 12 RIGID CVD (CANNULA) IMPLANT
VACURETTE 8 RIGID CVD (CANNULA) ×3 IMPLANT

## 2017-03-01 NOTE — Transfer of Care (Signed)
Immediate Anesthesia Transfer of Care Note  Patient: Penny Jones  Procedure(s) Performed: Procedure(s): DILATATION AND EVACUATION (N/A)  Patient Location: PACU  Anesthesia Type:General  Level of Consciousness: awake  Airway & Oxygen Therapy: Patient Spontanous Breathing  Post-op Assessment: Report given to RN  Post vital signs: stable  Last Vitals:  Vitals:   03/01/17 1139 03/01/17 1305  BP: 128/83 124/75  Pulse: 78 95  Resp: 16 17  Temp: 36.7 C 36.4 C    Last Pain:  Vitals:   03/01/17 1305  TempSrc:   PainSc: 0-No pain         Complications: No apparent anesthesia complications

## 2017-03-01 NOTE — Op Note (Signed)
  Operative Note  03/01/2017 1:00 PM  PRE-OP DIAGNOSIS: missed abortion   POST-OP DIAGNOSIS: same  SURGEON: Annamarie MajorPaul Hjalmar Ballengee, MD, FACOG  ANESTHESIA: General   PROCEDURE: Procedure(s): DILATATION AND CURETTAGE   ESTIMATED BLOOD LOSS: Minimal   SPECIMENS: POC  COMPLICATIONS: none  DISPOSITION: PACU - hemodynamically stable.  CONDITION: stable  FINDINGS: Exam under anesthesia revealed a 8 wk size uterus without palpable adnexal masses.   PROCEDURE IN DETAIL: After informed consent was obtained, the patient was taken to the operating room where anesthesia was obtained without difficulty. The patient was positioned in the dorsal lithotomy position with [employer] Stirrups. Time out was performed and an exam under anesthesia was performed. The vagina, perineum, and lower abdomen were prepped and draped in a normal sterile fashion. The bladder was emptied with an I&O catheter. A speculum was placed into the vagina and the cervix was grasped with a single toothed tenaculum. The uterus was sounded to 9cm.  The cervix was gently dilated to 20 [place] with  [employer] dilators. The suction was then tested and found to be adequate, and a 8mm rigid suction cannula was advanced into the uterine cavity. The suction was activated and the contents of the uterus were aspirated until no further tissue was obtained. The uterus was then curetted to gritty texture throughout.  At the end of the procedure bleeding was noted to be minimal .  All instruments were then removed from the vagina.The patient tolerated the procedure well. All sponge, instrument, and needle counts were correct. The patient was taken to the recovery room in good condition.

## 2017-03-01 NOTE — Discharge Instructions (Signed)
Dilation and Curettage or Vacuum Curettage, Care After This sheet gives you information about how to care for yourself after your procedure. Your health care provider may also give you more specific instructions. If you have problems or questions, contact your health care provider. What can I expect after the procedure? After your procedure, it is common to have:  Mild pain or cramping.  Some vaginal bleeding or spotting. These may last for up to 2 weeks after your procedure. Follow these instructions at home: Activity    Do not drive or use heavy machinery while taking prescription pain medicine.  Avoid driving for the first 24 hours after your procedure.  Take frequent, short walks, followed by rest periods, throughout the day. Ask your health care provider what activities are safe for you. After 1-2 days, you may be able to return to your normal activities.  Do not lift anything heavier than 10 lb (4.5 kg) until your health care provider approves.  For at least 2 weeks, or as long as told by your health care provider, do not:  Douche.  Use tampons.  Have sexual intercourse. General instructions    Take over-the-counter and prescription medicines only as told by your health care provider. This is especially important if you take blood thinning medicine.  Do not take baths, swim, or use a hot tub until your health care provider approves. Take showers instead of baths.  Wear compression stockings as told by your health care provider. These stockings help to prevent blood clots and reduce swelling in your legs.  It is your responsibility to get the results of your procedure. Ask your health care provider, or the department performing the procedure, when your results will be ready.  Keep all follow-up visits as told by your health care provider. This is important. Contact a health care provider if:  You have severe cramps that get worse or that do not get better with  medicine.  You have severe abdominal pain.  You cannot drink fluids without vomiting.  You develop pain in a different area of your pelvis.  You have bad-smelling vaginal discharge.  You have a rash. Get help right away if:  You have vaginal bleeding that soaks more than one sanitary pad in 1 hour, for 2 hours in a row.  You pass large blood clots from your vagina.  You have a fever that is above 100.70F (38.0C).  Your abdomen feels very tender or hard.  You have chest pain.  You have shortness of breath.  You cough up blood.  You feel dizzy or light-headed.  You faint.  You have pain in your neck or shoulder area. This information is not intended to replace advice given to you by your health care provider. Make sure you discuss any questions you have with your health care provider. Document Released: 10/06/2000 Document Revised: 06/07/2016 Document Reviewed: 05/11/2016 Elsevier Interactive Patient Education  2017 Elsevier Inc.  General Anesthesia, Adult, Care After These instructions provide you with information about caring for yourself after your procedure. Your health care provider may also give you more specific instructions. Your treatment has been planned according to current medical practices, but problems sometimes occur. Call your health care provider if you have any problems or questions after your procedure. What can I expect after the procedure? After the procedure, it is common to have:  Vomiting.  A sore throat.  Mental slowness. It is common to feel:  Nauseous.  Cold or shivery.  Sleepy.  Tired.  Sore or achy, even in parts of your body where you did not have surgery. Follow these instructions at home: For at least 24 hours after the procedure:   Do not:  Participate in activities where you could fall or become injured.  Drive.  Use heavy machinery.  Drink alcohol.  Take sleeping pills or medicines that cause drowsiness.  Make  important decisions or sign legal documents.  Take care of children on your own.  Rest. Eating and drinking   If you vomit, drink water, juice, or soup when you can drink without vomiting.  Drink enough fluid to keep your urine clear or pale yellow.  Make sure you have little or no nausea before eating solid foods.  Follow the diet recommended by your health care provider. General instructions   Have a responsible adult stay with you until you are awake and alert.  Return to your normal activities as told by your health care provider. Ask your health care provider what activities are safe for you.  Take over-the-counter and prescription medicines only as told by your health care provider.  If you smoke, do not smoke without supervision.  Keep all follow-up visits as told by your health care provider. This is important. Contact a health care provider if:  You continue to have nausea or vomiting at home, and medicines are not helpful.  You cannot drink fluids or start eating again.  You cannot urinate after 8-12 hours.  You develop a skin rash.  You have fever.  You have increasing redness at the site of your procedure. Get help right away if:  You have difficulty breathing.  You have chest pain.  You have unexpected bleeding.  You feel that you are having a life-threatening or urgent problem. This information is not intended to replace advice given to you by your health care provider. Make sure you discuss any questions you have with your health care provider. Document Released: 01/15/2001 Document Revised: 03/13/2016 Document Reviewed: 09/23/2015 Elsevier Interactive Patient Education  2017 [employer].

## 2017-03-01 NOTE — Anesthesia Postprocedure Evaluation (Signed)
Anesthesia Post Note  Patient: Jerl SantosMegan F Spayd  Procedure(s) Performed: Procedure(s) (LRB): DILATATION AND EVACUATION (N/A)  Patient location during evaluation: PACU Anesthesia Type: General Level of consciousness: awake and alert Pain management: pain level controlled Vital Signs Assessment: post-procedure vital signs reviewed and stable Respiratory status: spontaneous breathing, nonlabored ventilation, respiratory function stable and patient connected to nasal cannula oxygen Cardiovascular status: blood pressure returned to baseline and stable Postop Assessment: no signs of nausea or vomiting Anesthetic complications: no     Last Vitals:  Vitals:   03/01/17 1345 03/01/17 1405  BP:  119/71  Pulse: 75 61  Resp: 14 15  Temp:  36.8 C    Last Pain:  Vitals:   03/01/17 1405  TempSrc:   PainSc: 0-No pain                 Emran Molzahn S

## 2017-03-01 NOTE — Anesthesia Preprocedure Evaluation (Signed)
Anesthesia Evaluation  Patient identified by MRN, date of birth, ID band Patient awake    Reviewed: Allergy & Precautions, NPO status , Patient's Chart, lab work & pertinent test results, reviewed documented beta blocker date and time   Airway Mallampati: III  TM Distance: >3 FB     Dental  (+) Chipped   Pulmonary former smoker,           Cardiovascular      Neuro/Psych    GI/Hepatic GERD  Controlled,  Endo/Other    Renal/GU      Musculoskeletal   Abdominal   Peds  Hematology   Anesthesia Other Findings Obese.  Reproductive/Obstetrics                             Anesthesia Physical Anesthesia Plan  ASA: II  Anesthesia Plan: General   Post-op Pain Management:    Induction: Intravenous  Airway Management Planned: LMA  Additional Equipment:   Intra-op Plan:   Post-operative Plan:   Informed Consent: I have reviewed the patients History and Physical, chart, labs and discussed the procedure including the risks, benefits and alternatives for the proposed anesthesia with the patient or authorized representative who has indicated his/her understanding and acceptance.     Plan Discussed with: CRNA  Anesthesia Plan Comments:         Anesthesia Quick Evaluation

## 2017-03-01 NOTE — H&P (Signed)
History and Physical Interval Note:  03/01/2017 11:54 AM  Penny Jones  has presented today for surgery, with the diagnosis of missed abortion  The various methods of treatment have been discussed with the patient and family. After consideration of risks, benefits and other options for treatment, the patient has consented to  Procedure(s): DILATATION AND EVACUATION (N/A) as a surgical intervention .  The patient's history has been reviewed, patient examined, no change in status, stable for surgery.  Pt has the following beta blocker history-  Not taking Beta Blocker.  I have reviewed the patient's chart and labs.  Questions were answered to the patient's satisfaction.    Letitia Libraobert Paul Harris

## 2017-03-01 NOTE — Anesthesia Post-op Follow-up Note (Cosign Needed)
Anesthesia QCDR form completed.        

## 2017-03-02 ENCOUNTER — Encounter: Payer: Self-pay | Admitting: Obstetrics & Gynecology

## 2017-03-02 LAB — RHOGAM INJECTION: Unit division: 0

## 2017-03-02 LAB — SURGICAL PATHOLOGY

## 2017-03-16 ENCOUNTER — Ambulatory Visit (INDEPENDENT_AMBULATORY_CARE_PROVIDER_SITE_OTHER): Admitting: Obstetrics & Gynecology

## 2017-03-16 ENCOUNTER — Encounter: Payer: Self-pay | Admitting: Obstetrics & Gynecology

## 2017-03-16 VITALS — BP 130/90 | HR 88 | Ht 62.0 in | Wt 202.0 lb

## 2017-03-16 DIAGNOSIS — O021 Missed abortion: Secondary | ICD-10-CM

## 2017-03-16 NOTE — Progress Notes (Signed)
  Postoperative Follow-up [facility]  Subjective: Patient reports marked improvement in her preop symptoms. Eating a regular diet without difficulty. The patient is not having any pain.  Activity: normal activities of daily living. Patient reports vaginal sx's of None  Objective: BP 130/90   Pulse 88   Ht 5\' 2"  (1.575 m)   Wt 202 lb (91.6 kg)   BMI 36.95 kg/m  Physical Exam  Constitutional: She is oriented to person, place, and time. She appears well-developed and well-nourished. No distress.  Musculoskeletal: Normal range of motion.  Neurological: She is alert and oriented to person, place, and time.  Skin: Skin is warm and dry.  Psychiatric: She has a normal mood and affect.  Vitals reviewed.   Assessment: s/p :  D&C for MAb, scant bleeding, stable  Plan: Patient has done well after surgery with no apparent complications.  I have discussed the post-operative course to date, and the expected progress moving forward.  The patient understands what complications to be concerned about.  I will see the patient in routine follow up, or sooner if needed.    Activity plan: No restriction.  PNV. Try again soon. Labs/US early  Penny LibraRobert Jones Penny Jones 03/16/2017, 1:44 PM

## 2017-03-21 ENCOUNTER — Ambulatory Visit: Admitting: Obstetrics & Gynecology

## 2017-05-01 ENCOUNTER — Telehealth: Payer: Self-pay

## 2017-05-01 NOTE — Telephone Encounter (Signed)
Please advise 

## 2017-05-01 NOTE — Telephone Encounter (Signed)
Pt aware.

## 2017-05-01 NOTE — Telephone Encounter (Signed)
Pt had D&C 5/10th.  Wants to discuss c you how long it should take for her body to get back to normal cycles.  Has yet to have a cycle.  Has bloody d/c just not a full blown period.  What to do or what do you rec?  [phone]

## 2017-05-01 NOTE — Telephone Encounter (Signed)
Usually 1-2 mos after D&C will resume cycles, so anytime now. If not on hormones (birth control) then nothing else to do at this time. Let her know

## 2017-06-01 ENCOUNTER — Telehealth: Payer: Self-pay

## 2017-06-01 NOTE — Telephone Encounter (Signed)
Appt

## 2017-06-01 NOTE — Telephone Encounter (Signed)
Pt states she had D&C done 03/01/17 and has not had a full period since periods. Pt feels like her body wants to have a period but it is not occurring. Please advise. Cb# [phone] thank you.

## 2017-06-04 NOTE — Telephone Encounter (Signed)
Left msg for pt to call and schedule appt

## 2017-06-06 ENCOUNTER — Ambulatory Visit: Admitting: Obstetrics & Gynecology

## 2017-06-13 ENCOUNTER — Ambulatory Visit (INDEPENDENT_AMBULATORY_CARE_PROVIDER_SITE_OTHER): Admitting: Obstetrics & Gynecology

## 2017-06-13 ENCOUNTER — Encounter: Payer: Self-pay | Admitting: Obstetrics & Gynecology

## 2017-06-13 VITALS — BP 120/80 | HR 63 | Ht 62.0 in | Wt 216.0 lb

## 2017-06-13 DIAGNOSIS — N914 Secondary oligomenorrhea: Secondary | ICD-10-CM

## 2017-06-13 NOTE — Progress Notes (Signed)
  Amenorrhea Patient presents for evaluation of oligomenorrhea. Currently periods are occurring every month but barely any bleeding at all. Bleeding is spotting. Periods were regular in the past occurring every 4 weeks. Patient has no relevant history of abnormal sexual development. Is there a chance of pregnancy? no. HCG lab test done? no, not done. Factors that may be contributory to menstrual abnormalities include: weight gain since miscarriage several mos ago; D&C with that miscarriage. Previous treatments for menstrual abnormalities include: none.  PMHx: She  has a past medical history of GERD (gastroesophageal reflux disease) and Heart murmur. Also,  has a past surgical history that includes No past surgeries and Dilation and evacuation (N/A, 03/01/2017)., family history is not on file.,  reports that she has quit smoking. She has never used smokeless tobacco. She reports that she drinks alcohol. She reports that she does not use drugs.  She has a current medication list which includes the following prescription(s): acetaminophen, calcium carbonate, doxycycline, ibuprofen, and methylergonovine. Also, is allergic to pollen extract.  Review of Systems  Constitutional: Negative for chills, fever and malaise/fatigue.  HENT: Negative for congestion, sinus pain and sore throat.   Eyes: Negative for blurred vision and pain.  Respiratory: Negative for cough and wheezing.   Cardiovascular: Negative for chest pain and leg swelling.  Gastrointestinal: Negative for abdominal pain, constipation, diarrhea, heartburn, nausea and vomiting.  Genitourinary: Negative for dysuria, frequency, hematuria and urgency.  Musculoskeletal: Negative for back pain, joint pain, myalgias and neck pain.  Skin: Negative for itching and rash.  Neurological: Negative for dizziness, tremors and weakness.  Endo/Heme/Allergies: Does not bruise/bleed easily.  Psychiatric/Behavioral: Negative for depression. The patient is not  nervous/anxious and does not have insomnia.     Objective: BP 120/80   Pulse 63   Ht 5\' 2"  (1.575 m)   Wt 216 lb (98 kg)   BMI 39.51 kg/m  Physical Exam  Constitutional: She is oriented to person, place, and time. She appears well-developed and well-nourished. No distress.  Musculoskeletal: Normal range of motion.  Neurological: She is alert and oriented to person, place, and time.  Skin: Skin is warm and dry.  Psychiatric: She has a normal mood and affect.  Vitals reviewed.   ASSESSMENT/PLAN:   Visit Diagnoses    Secondary oligomenorrhea    -  Primary   Relevant Orders   Beta HCG, Quant   FSH/LH   TSH   Prolactin    Consider [place], adhesive dz s/p D&C Most likely from weight gain and hormonal disruption Consider Metformin, Clomid, other as she IS trying for pregnancy Weight gain and interventions discussed.  Annamarie Major, MD, [facility], [facility] 06/13/2017  11:38 AM

## 2017-06-13 NOTE — Patient Instructions (Signed)
Secondary Amenorrhea Secondary amenorrhea is the stopping of menstrual flow for 3-6 months in a female who has previously had periods. There are many possible causes. Most of these causes are not serious. Usually, treating the underlying problem causing the loss of menses will return your periods to normal. What are the causes? Some common and uncommon causes of not menstruating include:  Malnutrition.  Low blood sugar (hypoglycemia).  Polycystic ovary disease.  Stress or fear.  Breastfeeding.  Hormone imbalance.  Ovarian failure.  Medicines.  Extreme obesity.  Cystic fibrosis.  Low body weight or drastic weight reduction from any cause.  Early menopause.  Removal of ovaries or uterus.  Contraceptives.  Illness.  Long-term (chronic) illnesses.  Cushing syndrome.  Thyroid problems.  Birth control pills, patches, or vaginal rings for birth control.  What increases the risk? You may be at greater risk of secondary amenorrhea if:  You have a family history of this condition.  You have an eating disorder.  You do athletic training.  How is this diagnosed? A diagnosis is made by your health care provider taking a medical history and doing a physical exam. This will include a pelvic exam to check for problems with your reproductive organs. Pregnancy must be ruled out. Often, numerous blood tests are done to measure different hormones in the body. Urine testing may be done. Specialized exams (ultrasound, CT scan, MRI, or hysteroscopy) may have to be done as well as measuring the body mass index (BMI). How is this treated? Treatment depends on the cause of the amenorrhea. If an eating disorder is present, this can be treated with an adequate diet and therapy. Chronic illnesses may improve with treatment of the illness. Amenorrhea may be corrected with medicines, lifestyle changes, or surgery. If the amenorrhea cannot be corrected, it is sometimes possible to create a  false menstruation with medicines. Follow these instructions at home:  Maintain a healthy diet.  Manage weight problems.  Exercise regularly but not excessively.  Get adequate sleep.  Manage stress.  Be aware of changes in your menstrual cycle. Keep a record of when your periods occur. Note the date your period starts, how long it lasts, and any problems. Contact a health care provider if: Your symptoms do not get better with treatment. This information is not intended to replace advice given to you by your health care provider. Make sure you discuss any questions you have with your health care provider. Document Released: 11/20/2006 Document Revised: 03/16/2016 Document Reviewed: 03/27/2013 Elsevier Interactive Patient Education  2018 Elsevier Inc.  

## 2017-06-14 LAB — FSH/LH
FSH: 4.3 m[IU]/mL
LH: 4.9 m[IU]/mL

## 2017-06-14 LAB — PROLACTIN: PROLACTIN: 11.4 ng/mL (ref 4.8–23.3)

## 2017-06-14 LAB — TSH: TSH: 1.88 u[IU]/mL (ref 0.450–4.500)

## 2017-06-14 LAB — BETA HCG QUANT (REF LAB): hCG Quant: <1 m[IU]/mL

## 2017-06-15 ENCOUNTER — Telehealth: Payer: Self-pay

## 2017-06-15 NOTE — Progress Notes (Signed)
Let her know all labs normal; no specific hormonal cause for her lack of periods and changes in weight.  Recommend she continue aggressive measures at weight loss and monitoring for ovulation by way of over the counter ovulation predictor kits.  Can consider meds down the road for ovulation if not achieving pregnancy.

## 2017-06-15 NOTE — Telephone Encounter (Signed)
Returning call from PH/JP.  [phone]

## 2017-06-15 NOTE — Progress Notes (Signed)
lmtrc

## 2018-03-29 ENCOUNTER — Ambulatory Visit (INDEPENDENT_AMBULATORY_CARE_PROVIDER_SITE_OTHER): Admitting: Obstetrics & Gynecology

## 2018-03-29 ENCOUNTER — Encounter: Payer: Self-pay | Admitting: Obstetrics & Gynecology

## 2018-03-29 VITALS — BP 120/80 | Ht 62.0 in | Wt 233.0 lb

## 2018-03-29 DIAGNOSIS — N970 Female infertility associated with anovulation: Secondary | ICD-10-CM

## 2018-03-29 NOTE — Progress Notes (Signed)
Gynecology Infertility Exam  PCP: Patient, No Pcp Per  History of Present Illness: Patient is a 32 y.o. G2P1011 presenting for evaluation of infertility. Patient and partner have been attempting conception for one year. Marital Status: married for a few years. Pregnancies with current partner yes  Menstrual and Endocrine History LMP: Patient's last menstrual period was 03/24/2018. Menarche:not applicable Shortest Interval: 27 Longest Interval: 30  days Duration of flow: 4 days Heavy Menses: no Clots: no Intermenstrual Bleeding: no Postcoital Bleeding: no Dysmenorrhea: no Amenorrhea: in past Wt Change: weight gain from Oct to Jan then losing agian Hirsutism: no Balding: no Acne: no Galactorrhea: no  Obstetrical History History: Living children: 1  Gynecologic History Last PAP: normal Previous abdominal or pelvic surgery: yes Pelvic Pain:  no Endometriosis: no Hot Flashes: no DES Exposure: no Abnormal Pap: no Cervix Cryo/cone: no STD: no PID: no  Infertility and Endocrine Studies BBT: no Endo. Bx.:no HSG: no PCT: no Laparoscopy: no Hormonal Studies: yes Semen analysis: no Other Studies: no Meds: none Other Therapies: Not applicable Insemination:not applicable  Sexual History Frequency: a few times per week(s) Satisfied: yes Dyspareunia: yes, superficial Use of Lubricant: no Douching: no Number of lifetime sex partners: unk  Contraception None  Family History Thyroid Problems: no Heart Condition or High Blood Pressure: no Blood Clot or Stroke: no Diabetes: no Cancer: no Birth Defects/Inherited diseases:no Infectious diseases (mumps, TB, Rubella):no Other Medical Problems: no MR/autism/fragile X or POF: no  Habits Cigarettes:    Wife -  no    Husband - no Alcohol:    Wife -  no    Husband - no Marijuana: no  PMHx: She  has a past medical history of GERD (gastroesophageal reflux disease) and Heart murmur. Also,  has a past surgical  history that includes No past surgeries and Dilation and evacuation (N/A, 03/01/2017)., family history includes Hypertension in her father.,  reports that she has quit smoking. She has never used smokeless tobacco. She reports that she drinks alcohol. She reports that she does not use drugs.  She has a current medication list which includes the following prescription(s): acetaminophen, calcium carbonate, doxycycline, ibuprofen, and methylergonovine. Also, is allergic to pollen extract.  Review of Systems  Constitutional: Negative for chills, fever and malaise/fatigue.  HENT: Negative for congestion, sinus pain and sore throat.   Eyes: Negative for blurred vision and pain.  Respiratory: Negative for cough and wheezing.   Cardiovascular: Negative for chest pain and leg swelling.  Gastrointestinal: Negative for abdominal pain, constipation, diarrhea, heartburn, nausea and vomiting.  Genitourinary: Negative for dysuria, frequency, hematuria and urgency.  Musculoskeletal: Negative for back pain, joint pain, myalgias and neck pain.  Skin: Negative for itching and rash.  Neurological: Negative for dizziness, tremors and weakness.  Endo/Heme/Allergies: Does not bruise/bleed easily.  Psychiatric/Behavioral: Negative for depression. The patient is not nervous/anxious and does not have insomnia.     Objective: BP 120/80   Ht 5\' 2"  (1.575 m)   Wt 233 lb (105.7 kg)   LMP 03/24/2018   BMI 42.62 kg/m  Physical Exam  Constitutional: She is oriented to person, place, and time. She appears well-developed and well-nourished. No distress.  Musculoskeletal: Normal range of motion.  Neurological: She is alert and oriented to person, place, and time.  Skin: Skin is warm and dry.  Psychiatric: She has a normal mood and affect.  Vitals reviewed.  Assessment: 32 y.o. G2P1011 1. Infertility associated with anovulation - DG Hysterogram (HSG); Future - SA - Labs  UTD - Clomid next month after HSG and SA  results  2. Tubal Disease Risk based on prior surgery (D&C)  Plan: 1) We discussed the underlying etiologies which may be implicated in a couple experiencing difficulty conceiving.  The average couple will conceive within the span of 1 year with unprotected coitus, with a monthly fecundity rate of 20% or 1 in 5.  Even without further work up or intervention the patient and her partner may be successful in conceiving unassisted, although if an underlying etiology can be identified and addressed fecundity rate may improve.  The work up entails examining for ovulatory function, tubal patency, and ruling out female factor infertility.  These may be looked at concurrently or sequentially.  The downside of sequential work up is that this method may miss issues if more than one compartment is contributing.  She is aware that tubal factor or moderate to severe female factor infertility will require further consultation with a reproductive endocrinologist.  In the case of anovulation, use of Clomid (clomiphen citrate) or Femara (letrazole) were discussed with the understanding the the later is an off-label, but well supported use.  With either of these drugs the risk of multiples increases from the standard population rate of 2% to approximately 10%, with higher order multiples possible but unlikely.  Both drugs may require some time to titrate to the appropriate dosage to ensure consistent ovulation.  Cycles will be limited to 6 cycles on each drug secondary to decreasing rates of conception after 6 cycles.  In addition should patient be started on ovulation induction with Clomid she was advised to discontinue the drug for any vision changes as this is a rare but potentially permanent side-effect if medication is continued.  We discussed timing of intercourse as well as the use of ovulation predictor kits identify the patient's fertile window each month.     2) Preconception counseling - immunization up to date.  The  patient denies any family history of conditions which would warrant preconception genetic counseling or testing on her or her partner.  Instructed to start prenatal vitamins while trying to conceive.    A total of 30 minutes were spent face-to-face with the patient during this encounter and over half of that time dealt with counseling and coordination of care.  Annamarie Major, MD, [facility], [facility] 03/29/2018  5:19 PM

## 2018-04-04 ENCOUNTER — Ambulatory Visit
Admission: RE | Admit: 2018-04-04 | Discharge: 2018-04-04 | Disposition: A | Discharge status: Home / Self Care | Source: Ambulatory Visit | Attending: Obstetrics & Gynecology | Admitting: Obstetrics & Gynecology

## 2018-04-04 DIAGNOSIS — N970 Female infertility associated with anovulation: Secondary | ICD-10-CM | POA: Insufficient documentation

## 2018-04-04 DIAGNOSIS — O021 Missed abortion: Secondary | ICD-10-CM

## 2018-04-04 MED ORDER — IOPAMIDOL (ISOVUE-370) INJECTION 76%
15.0000 mL | Freq: Once | INTRAVENOUS | Status: AC | PRN
  Administered 2018-04-04: 15 mL

## 2018-04-04 NOTE — Progress Notes (Signed)
HSG- Prep and drape done.  Cervix normal.  Tenaculum not needed nor placed. Cervix dilated minimally.  Catheter placed into uterine cavity. Approximately 10 mL dye injected under fluoroscopic visualization.  Patent bilateral fallopian tube seen.  Catheter and tenaculum removed.  Pt stable, tolerated procedure well.  Annamarie MajorPaul Alia Parsley, MD, [facility]inda FrederickFACOG [facility], [facility] 04/04/2018  2:10 PM

## 2018-04-17 ENCOUNTER — Telehealth: Payer: Self-pay | Admitting: Obstetrics & Gynecology

## 2018-04-17 NOTE — Telephone Encounter (Signed)
Pt aware to drop off lab slip

## 2018-04-17 NOTE — Telephone Encounter (Signed)
Patient husband presents at [facility] for a drop off for an [employer] analysis. Keisha from [employer] is calling needing an order to enter the results. Please advise . Faxed order to [phone] . cb back [phone]

## 2018-04-22 NOTE — Telephone Encounter (Signed)
Pt calling for semen analysis results.  [phone].  Left detailed msg results are not back yet.  It can take a week.

## 2018-04-30 ENCOUNTER — Telehealth: Payer: Self-pay | Admitting: Obstetrics & Gynecology

## 2018-04-30 NOTE — Telephone Encounter (Signed)
Patient wants results from her husband's semen test.

## 2018-04-30 NOTE — Telephone Encounter (Signed)
Print out of results are here at your work station, pt aware you will call her when you have a chance to review them.

## 2018-04-30 NOTE — Telephone Encounter (Signed)
Patient came by office today to see if results were back on semen analysis as it has been two weeks now since dropped off.  Patient aware RPH not in office today but if nurse could give her a call with update.  Best cb [phone].

## 2018-05-02 ENCOUNTER — Other Ambulatory Visit: Payer: Self-pay | Admitting: Obstetrics & Gynecology

## 2018-05-02 MED ORDER — CLOMIPHENE CITRATE 50 MG PO TABS: 50.0000 mg | ORAL_TABLET | Freq: Every day | ORAL | 0 refills | Status: DC

## 2018-05-02 NOTE — Telephone Encounter (Signed)
LM.  Results normal.  Will discuss plan on call back. HSG normal, Labs norma, SA normal. Can plan Clomid next cycle

## 2018-05-02 NOTE — Telephone Encounter (Signed)
Patient is calling for labs results. Please advise. 

## 2018-05-20 ENCOUNTER — Encounter: Payer: Self-pay | Admitting: Obstetrics & Gynecology

## 2018-07-21 ENCOUNTER — Encounter: Payer: Self-pay | Admitting: Obstetrics & Gynecology

## 2018-07-23 ENCOUNTER — Other Ambulatory Visit: Payer: Self-pay | Admitting: Obstetrics & Gynecology

## 2018-07-23 MED ORDER — CLOMIPHENE CITRATE 50 MG PO TABS: 100.0000 mg | ORAL_TABLET | Freq: Every day | ORAL | 0 refills | Status: DC

## 2018-07-23 NOTE — Progress Notes (Signed)
Clomid Follow Up Pt has been on Clomid 50mg  last cycle, which was cycle # 1.  She reports no side effects and Ovulation Predictor Testing Positive on day 14.  No other meds, except PNV.  Counseled on plan.    Clomid 100mg  this cycle, Rx done.    Cont OPK.    Monitor for cycle/ uCG testing.  Follow up for pos preg test or call with period/neg testing.  Annamarie Major, MD, [facility], [facility] 07/23/2018  3:47 PM

## 2018-08-16 ENCOUNTER — Encounter: Payer: Self-pay | Admitting: Obstetrics & Gynecology

## 2018-08-18 ENCOUNTER — Encounter: Payer: Self-pay | Admitting: Obstetrics & Gynecology

## 2018-08-19 ENCOUNTER — Other Ambulatory Visit: Payer: Self-pay | Admitting: Obstetrics & Gynecology

## 2018-08-19 MED ORDER — CLOMIPHENE CITRATE 50 MG PO TABS: 100.0000 mg | ORAL_TABLET | Freq: Every day | ORAL | 0 refills | Status: DC

## 2018-08-19 NOTE — Progress Notes (Signed)
Clomid Follow Up Pt has been on Clomid 100mg  last cycle, which was cycle # 2.  She reports no side effects and Ovulation Predictor Testing Positive on day 14.  No other meds, except PNV.  LMP-   Counseled on plan.    Clomid 100mg  this cycle, Rx done.    Cont OPK.    Monitor for cycle/ uCG testing.  Follow up for pos preg test or call with period/neg testing.  Annamarie Major, MD, [facility], [facility] 08/19/2018  8:11 AM

## 2018-09-15 ENCOUNTER — Encounter: Payer: Self-pay | Admitting: Obstetrics & Gynecology

## 2018-10-24 ENCOUNTER — Encounter: Payer: Self-pay | Admitting: Obstetrics & Gynecology

## 2018-10-26 ENCOUNTER — Encounter: Payer: Self-pay | Admitting: Obstetrics & Gynecology

## 2018-10-29 ENCOUNTER — Encounter: Payer: Self-pay | Admitting: Obstetrics & Gynecology

## 2018-10-30 NOTE — Telephone Encounter (Signed)
Patient is schedule 10/31/18 at 3 with [facility]

## 2018-10-30 NOTE — Telephone Encounter (Signed)
Sch appt this week for pt w me

## 2018-10-31 ENCOUNTER — Ambulatory Visit (INDEPENDENT_AMBULATORY_CARE_PROVIDER_SITE_OTHER): Admitting: Obstetrics & Gynecology

## 2018-10-31 ENCOUNTER — Encounter: Payer: Self-pay | Admitting: Obstetrics & Gynecology

## 2018-10-31 VITALS — BP 140/100 | Ht 62.0 in | Wt 205.0 lb

## 2018-10-31 DIAGNOSIS — N921 Excessive and frequent menstruation with irregular cycle: Secondary | ICD-10-CM | POA: Diagnosis not present

## 2018-10-31 NOTE — Progress Notes (Signed)
  Problem Visit   Chief Complaint:  Abnormal bleeding  History of Present Illness: Patient is a 33 y.o. G2P1011 w LMP 09/18/18, then last for period in Dec but has been bleeding heavy for 2 weeks ever since.  Some pain.  Some nausea.  Breast T vs muscle T from activity.  Is bleeding equal to or greater than normal menstrual flow:  Yes Any recent trauma:  No Recent intercourse:  No History of prior miscarriage:  Yes Prior ultrasound demonstrating IUP:  No Prior ultrasound demonstrating viable IUP:  No Prior Serum HCG:  No Rh status: NEG  PMHx: She  has a past medical history of GERD (gastroesophageal reflux disease) and Heart murmur. Also,  has a past surgical history that includes No past surgeries and Dilation and evacuation (N/A, 03/01/2017)., family history includes Hypertension in her father.,  reports that she has quit smoking. She has never used smokeless tobacco. She reports current alcohol use. She reports that she does not use drugs.  She has a current medication list which includes the following prescription(s): acetaminophen, calcium carbonate, clomiphene, and ibuprofen. Also, is allergic to pollen extract.  Review of Systems  All other systems reviewed and are negative.  Objective: Vitals:   10/31/18 1501  BP: (!) 140/100   Physical Exam Constitutional:      General: She is not in acute distress.    Appearance: She is well-developed.  Musculoskeletal: Normal range of motion.  Neurological:     Mental Status: She is alert and oriented to person, place, and time.  Skin:    General: Skin is warm and dry.  Vitals signs reviewed.   Assessment: 33 y.o. G2P1011  1. Menometrorrhagia - Beta hCG quant (ref lab)  2. vs. First trimester bleeding - incidence and clinical course of first trimester bleeding is discussed in detail with the patient today.  Approximately 1/3 of pregnancies ending in live births experienced 1st trimester bleeding.  The amount of bleeding is  variable and not necessarily predictive of outcome.  Sources may be cervical or uterine.  Subchorionic hemorrhages are a frequent concurrent findings on ultrasound and are followed expectantly.  These often absorb or regress spontaneously although risk for expansion and further disruption of the utero-placental interface leading to miscarriage is possible.  There is no clearly documented benefit to limiting or modifying activity and sexual intercourse in altering clinic course of 1st trimester bleeding.    Will trend HCG levels if pos.  3. The patient is Rh NEG, rhogam is therefore  indicated to decrease the risk rhesus alloimmunization.  Will have come for this (and US) if hCG POS.  4. Routine bleeding precautions were discussed with the patient prior the conclusion of today's visit.  5. Plan Provera and reassessment of cycles if not pregnant.  Wait for next Clomid attempt until she has reg cycle again  Annamarie MajorPaul Jimesha Rising, MD, Merlinda FrederickFACOG [facility], [facility] 10/31/2018  3:28 PM

## 2018-11-01 ENCOUNTER — Other Ambulatory Visit: Payer: Self-pay | Admitting: Obstetrics & Gynecology

## 2018-11-01 DIAGNOSIS — O200 Threatened abortion: Secondary | ICD-10-CM

## 2018-11-01 LAB — BETA HCG QUANT (REF LAB): HCG QUANT: 38 m[IU]/mL

## 2018-11-01 NOTE — Progress Notes (Signed)
Pt has had bleeding for 2 weeks, starting a few days after the time her period was due.  Prior LMP was 09/18/18.  Should be [redacted] weeks along. Beta 38 Counseled as to likelihood of miscarriage with bleeding from a early pregnancy then loss Will recheck beta Monday to confirm. Monitor for pain and bleeding this weekend  Annamarie Major, MD, [facility], [facility] 11/01/2018  7:54 AM

## 2018-11-01 NOTE — Progress Notes (Signed)
Pt will come in Monday afternoon for lab test (she is aware)

## 2018-11-04 ENCOUNTER — Other Ambulatory Visit

## 2018-11-04 DIAGNOSIS — O200 Threatened abortion: Secondary | ICD-10-CM

## 2018-11-05 ENCOUNTER — Encounter: Payer: Self-pay | Admitting: Obstetrics & Gynecology

## 2018-11-05 LAB — BETA HCG QUANT (REF LAB): HCG QUANT: 46 m[IU]/mL

## 2018-11-05 NOTE — Progress Notes (Signed)
Pt have lab done on Monday 11/11/18 (she is aware)

## 2018-11-05 NOTE — Progress Notes (Signed)
Beta 38, now 46. Some bleeding.  Min pain. Plan to follow beta hCG levels until zero. Pt counseled on s/sx to watch for (ectopic, incomplete Ab)  Penny Major, MD, [facility], [facility] 11/05/2018  9:53 AM

## 2018-11-08 ENCOUNTER — Encounter: Payer: Self-pay | Admitting: Obstetrics & Gynecology

## 2018-11-11 ENCOUNTER — Other Ambulatory Visit

## 2018-11-11 DIAGNOSIS — O200 Threatened abortion: Secondary | ICD-10-CM

## 2018-11-12 ENCOUNTER — Encounter: Payer: Self-pay | Admitting: Obstetrics & Gynecology

## 2018-11-12 LAB — BETA HCG QUANT (REF LAB): hCG Quant: 22 m[IU]/mL

## 2019-03-10 IMAGING — RF DG HYSTEROGRAM
1 series · 7 of 7 positions shown · non-contrast
Comparison: None.

CLINICAL DATA: Infertility.

EXAM:
HYSTEROSALPINGOGRAM
TECHNIQUE: Following performance of a hysterosalpingogram with by the patient's
physician imaging obtained.

[Series 1: fluoro_hsg_singleshot_bw · 0.17mm/px · 7 of 7 slices shown]
[im 1/7]
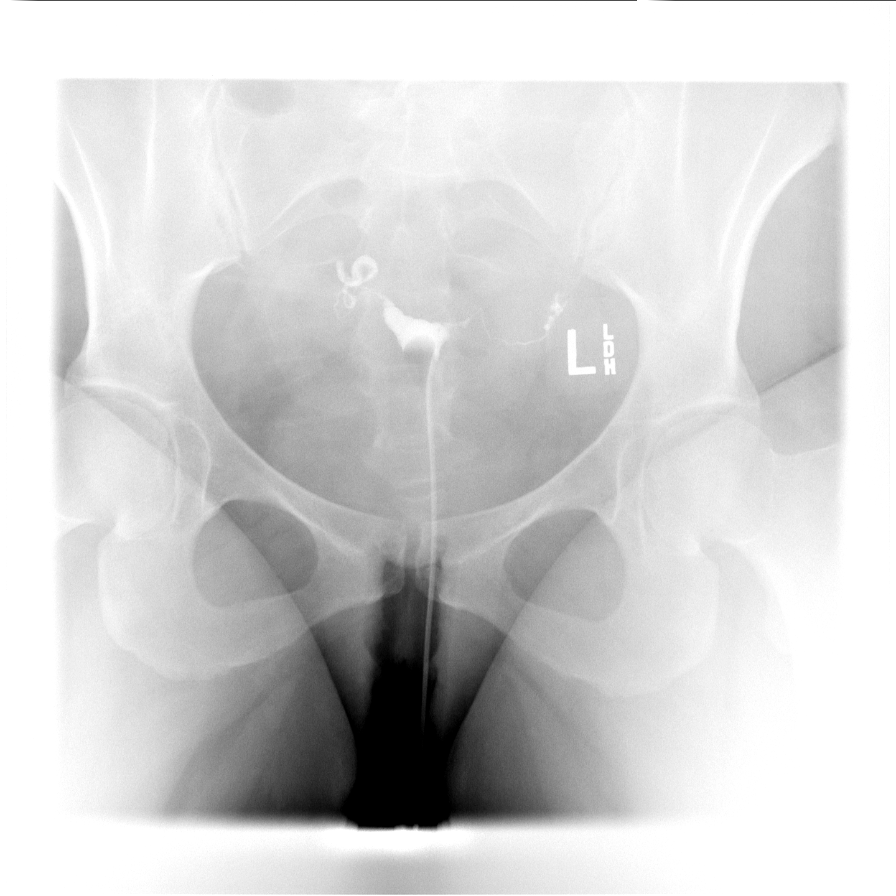
[im 2/7]
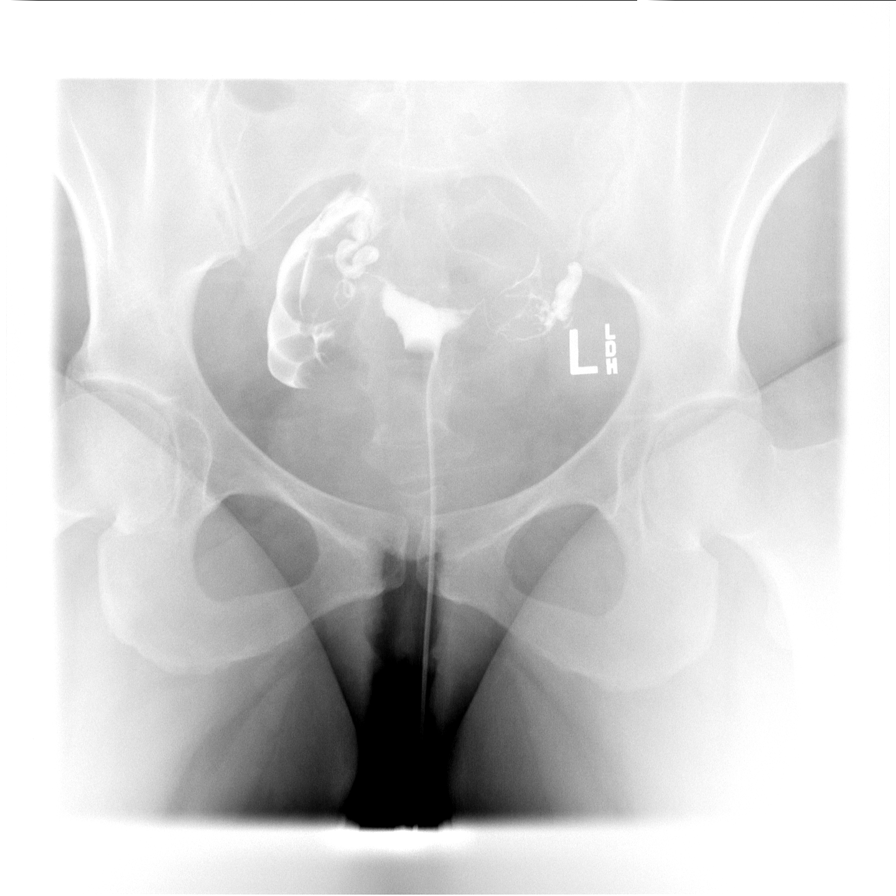
[im 3/7]
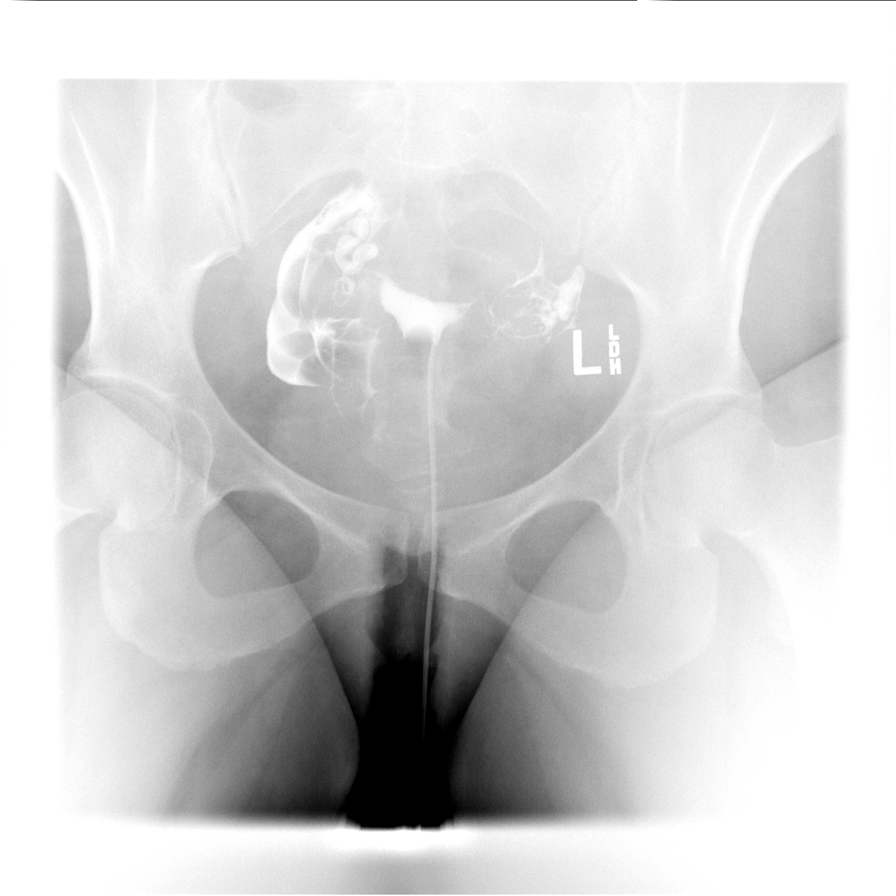
[im 4/7]
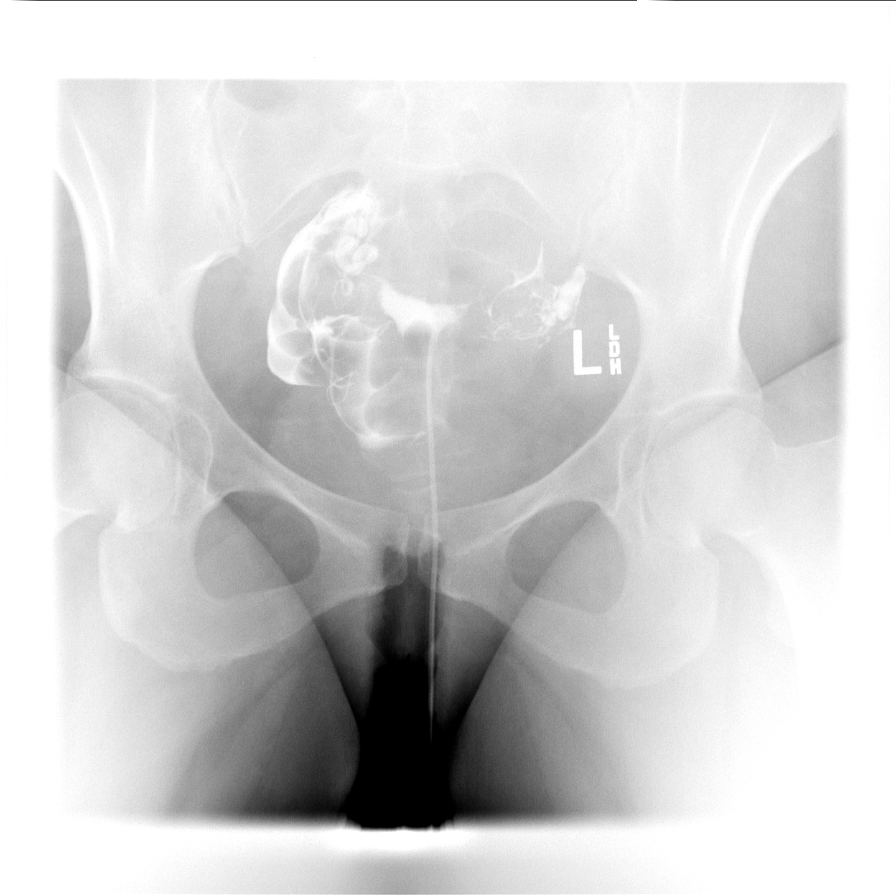
[im 5/7]
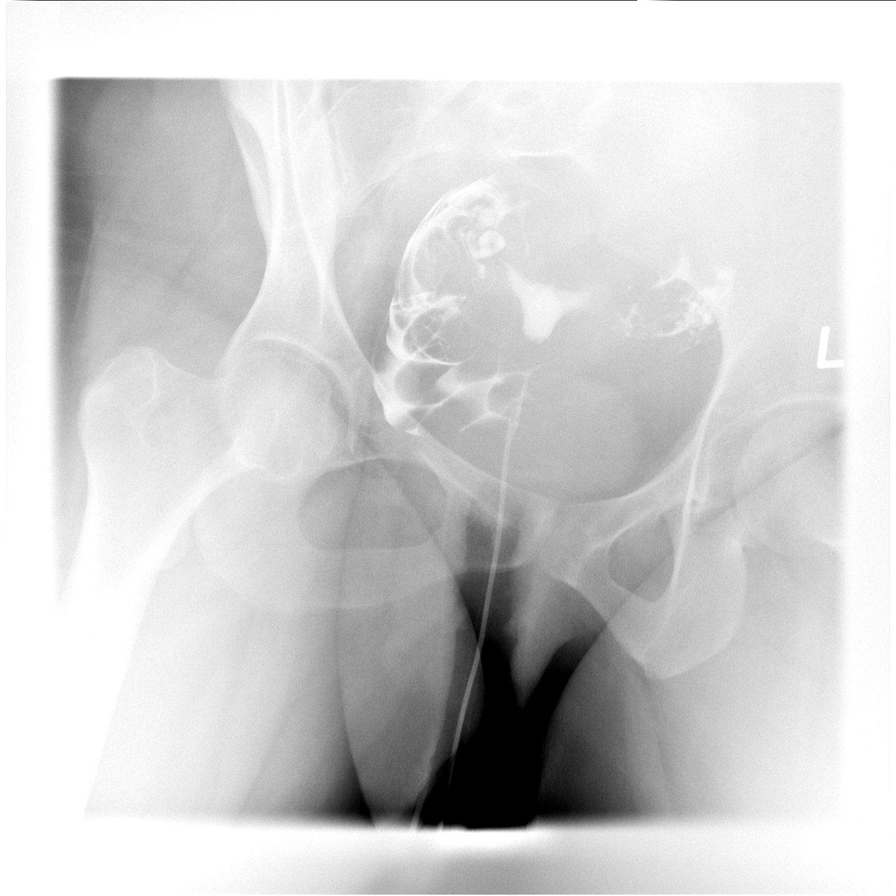
[im 6/7]
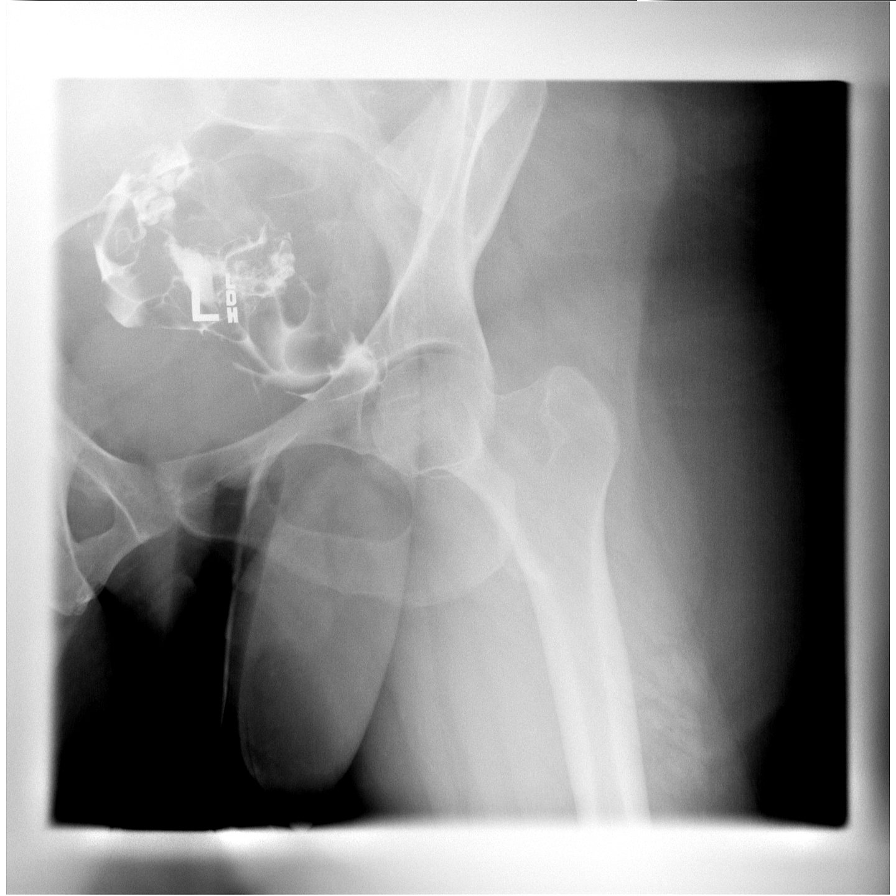
[im 7/7]
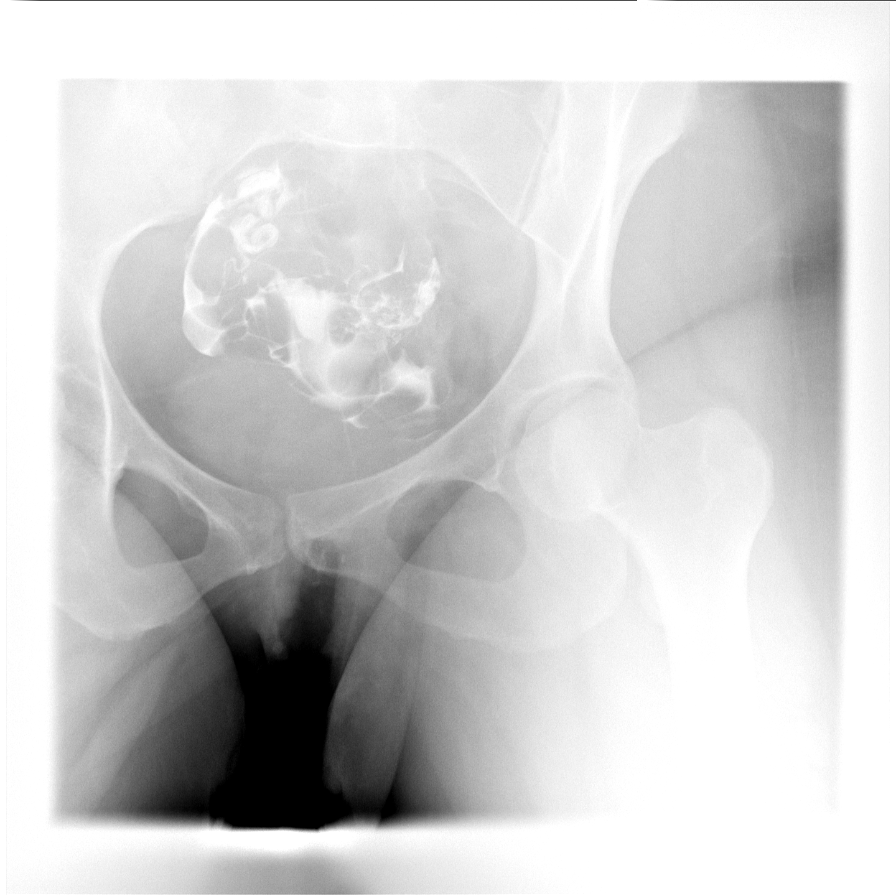

[7 of 7 positions shown; findings below may reference images not displayed]

FLUOROSCOPY TIME:  Radiation Exposure Index (as provided by the
fluoroscopic device): 27.2 mGy

If the device does not provide the exposure index:

Fluoroscopy Time:  0 minutes 30 seconds

Number of Acquired Images:  7
FINDINGS: Uterus and fallopian tubes are widely patent. Bilateral free spill
noted. No focal abnormality identified.
IMPRESSION: Widely patent fallopian tubes.  No focal abnormality identified.

## 2019-03-24 ENCOUNTER — Encounter: Payer: Self-pay | Admitting: Obstetrics & Gynecology

## 2019-03-24 ENCOUNTER — Other Ambulatory Visit: Payer: Self-pay | Admitting: Obstetrics & Gynecology

## 2019-03-24 DIAGNOSIS — N970 Female infertility associated with anovulation: Secondary | ICD-10-CM

## 2019-03-24 NOTE — Telephone Encounter (Signed)
Please schedule

## 2019-03-24 NOTE — Telephone Encounter (Signed)
Please advise 

## 2019-03-24 NOTE — Telephone Encounter (Signed)
Lab Appt TUES and THURS afternoons this week  (Beta hCG levels) Then, Sch NOB w PH next week Thx PH

## 2019-03-25 ENCOUNTER — Other Ambulatory Visit: Payer: Self-pay

## 2019-03-25 ENCOUNTER — Other Ambulatory Visit

## 2019-03-25 DIAGNOSIS — N970 Female infertility associated with anovulation: Secondary | ICD-10-CM

## 2019-03-26 ENCOUNTER — Encounter: Payer: Self-pay | Admitting: Obstetrics & Gynecology

## 2019-03-26 LAB — BETA HCG QUANT (REF LAB): hCG Quant: 1295 m[IU]/mL

## 2019-03-27 ENCOUNTER — Other Ambulatory Visit

## 2019-03-27 ENCOUNTER — Other Ambulatory Visit: Payer: Self-pay

## 2019-03-27 DIAGNOSIS — N970 Female infertility associated with anovulation: Secondary | ICD-10-CM

## 2019-03-28 LAB — BETA HCG QUANT (REF LAB): hCG Quant: 2932 m[IU]/mL

## 2019-03-30 ENCOUNTER — Encounter: Payer: Self-pay | Admitting: Obstetrics & Gynecology

## 2019-03-31 ENCOUNTER — Other Ambulatory Visit (HOSPITAL_COMMUNITY)
Admission: RE | Admit: 2019-03-31 | Discharge: 2019-03-31 | Disposition: A | Discharge status: Home / Self Care | Source: Ambulatory Visit | Attending: Obstetrics & Gynecology | Admitting: Obstetrics & Gynecology

## 2019-03-31 ENCOUNTER — Other Ambulatory Visit: Payer: Self-pay

## 2019-03-31 ENCOUNTER — Encounter: Payer: Self-pay | Admitting: Obstetrics & Gynecology

## 2019-03-31 ENCOUNTER — Ambulatory Visit (INDEPENDENT_AMBULATORY_CARE_PROVIDER_SITE_OTHER): Admitting: Obstetrics & Gynecology

## 2019-03-31 VITALS — BP 120/80 | Wt 207.0 lb

## 2019-03-31 DIAGNOSIS — Z3A01 Less than 8 weeks gestation of pregnancy: Secondary | ICD-10-CM

## 2019-03-31 DIAGNOSIS — Z369 Encounter for antenatal screening, unspecified: Secondary | ICD-10-CM

## 2019-03-31 DIAGNOSIS — Z124 Encounter for screening for malignant neoplasm of cervix: Secondary | ICD-10-CM | POA: Insufficient documentation

## 2019-03-31 DIAGNOSIS — Z131 Encounter for screening for diabetes mellitus: Secondary | ICD-10-CM

## 2019-03-31 DIAGNOSIS — Z3490 Encounter for supervision of normal pregnancy, unspecified, unspecified trimester: Secondary | ICD-10-CM

## 2019-03-31 DIAGNOSIS — Z3481 Encounter for supervision of other normal pregnancy, first trimester: Secondary | ICD-10-CM

## 2019-03-31 DIAGNOSIS — N970 Female infertility associated with anovulation: Secondary | ICD-10-CM

## 2019-03-31 LAB — OB RESULTS CONSOLE VARICELLA ZOSTER ANTIBODY, IGG: Varicella: IMMUNE

## 2019-03-31 MED ORDER — VITAFOL ULTRA 29-0.6-0.4-200 MG PO CAPS: 1.0000 | ORAL_CAPSULE | Freq: Every day | ORAL | 11 refills | Status: AC

## 2019-03-31 NOTE — Progress Notes (Signed)
03/31/2019   Chief Complaint: Missed period  Transfer of Care Patient: no  History of Present Illness: Ms. Josiah LoboJarman is a 33 y.o. [id number] 3445w4d based on Patient's last menstrual period was 02/20/2019. with an Estimated Date of Delivery: 11/27/19, with the above CC.   Her periods were: irregular periods from 28 to 35 days She was using no method when she conceived.  She has Positive signs or symptoms of nausea/vomiting of pregnancy. She has Negative signs or symptoms of miscarriage or preterm labor She identifies Negative Zika risk factors for her and her partner On any different medications around the time she conceived/early pregnancy: No  History of varicella: Yes   ROS: A 12-point review of systems was performed and negative, except as stated in the above HPI.  OBGYN History: As per HPI. OB History  Gravida Para Term Preterm AB Living  4 1 1   2 1   SAB TAB Ectopic Multiple Live Births  2            # Outcome Date GA Lbr Len/2nd Weight Sex Delivery Anes PTL Lv  4 Current           3 Term 06/08/12 1361w0d  7 lb 2.2 oz (3.239 kg) F Vag-Spont     2 SAB           1 SAB             Any issues with any prior pregnancies: no Any prior children are healthy, doing well, without any problems or issues: yes History of pap smears: Yes. Last pap smear 2018. Abnormal: no  History of STIs: No   Past Medical History: Past Medical History:  Diagnosis Date  . GERD (gastroesophageal reflux disease)    OCC  . Heart murmur    ASYMPTOMATIC    Past Surgical History: Past Surgical History:  Procedure Laterality Date  . DILATION AND EVACUATION N/A 03/01/2017   Procedure: DILATATION AND EVACUATION;  Surgeon: Nadara MustardHarris, Jaidan Stachnik P, MD;  Location: [facility] ORS;  Service: Gynecology;  Laterality: N/A;  . NO PAST SURGERIES      Family History:  Family History  Problem Relation Age of Onset  . Hypertension Father    She denies any female cancers, bleeding or blood clotting disorders.  She denies any  history of mental retardation, birth defects or genetic disorders in her or the FOB's history  Social History:  Social History   Socioeconomic History  . Marital status: Married    Spouse name: Not on file  . Number of children: Not on file  . Years of education: Not on file  . Highest education level: Not on file  Occupational History  . Not on file  Social Needs  . Financial resource strain: Not on file  . Food insecurity:    Worry: Not on file    Inability: Not on file  . Transportation needs:    Medical: Not on file    Non-medical: Not on file  Tobacco Use  . Smoking status: Former Games developermoker  . Smokeless tobacco: Never Used  Substance and Sexual Activity  . Alcohol use: Yes    Comment: 6 BEERS PER WEEK  . Drug use: No  . Sexual activity: Yes    Birth control/protection: None  Lifestyle  . Physical activity:    Days per week: Not on file    Minutes per session: Not on file  . Stress: Not on file  Relationships  . Social connections:    Talks  on phone: Not on file    Gets together: Not on file    Attends religious service: Not on file    Active member of club or organization: Not on file    Attends meetings of clubs or organizations: Not on file    Relationship status: Not on file  . Intimate partner violence:    Fear of current or ex partner: Not on file    Emotionally abused: Not on file    Physically abused: Not on file    Forced sexual activity: Not on file  Other Topics Concern  . Not on file  Social History Narrative  . Not on file   Any pets in the household: no  Allergy: Allergies  Allergen Reactions  . Pollen Extract     Current Outpatient Medications:  Current Outpatient Medications:  .  acetaminophen (TYLENOL) 500 MG tablet, Take 500-1,000 mg by mouth every 6 (six) hours as needed (for pain/headaches.)., Disp: , Rfl:  .  calcium carbonate (TUMS - DOSED IN MG ELEMENTAL CALCIUM) 500 MG chewable tablet, Chew 1 tablet by mouth as needed for  indigestion or heartburn., Disp: , Rfl:    Physical Exam:   BP 120/80   Wt 207 lb (93.9 kg)   LMP 02/20/2019   BMI 37.86 kg/m  Body mass index is 37.86 kg/m. Constitutional: Well nourished, well developed female in no acute distress.  Neck:  Supple, normal appearance, and no thyromegaly  Cardiovascular: S1, S2 normal, no murmur, rub or gallop, regular rate and rhythm Respiratory:  Clear to auscultation bilateral. Normal respiratory effort Abdomen: positive bowel sounds and no masses, hernias; diffusely non tender to palpation, non distended Breasts: breasts appear normal, no suspicious masses, no skin or nipple changes or axillary nodes. Neuro/Psych:  Normal mood and affect.  Skin:  Warm and dry.  Lymphatic:  No inguinal lymphadenopathy.   Pelvic exam: is not limited by body habitus EGBUS: within normal limits, Vagina: within normal limits and with no blood in the vault, Cervix: normal appearing cervix without discharge or lesions, closed/long/high, Uterus:  enlarged: min, and Adnexa:  no mass, fullness, tenderness  Assessment: Ms. Oddo is a 33 y.o. [id number] [redacted]w[redacted]d based on Patient's last menstrual period was 02/20/2019. with an Estimated Date of Delivery: 11/27/19,  for prenatal care.  Plan:  1) Avoid alcoholic beverages. 2) Patient encouraged not to smoke.  3) Discontinue the use of all non-medicinal drugs and chemicals.  4) Take prenatal vitamins daily.  5) Seatbelt use advised 6) Nutrition, food safety (fish, cheese advisories, and high nitrite foods) and exercise discussed. 7) Hospital and practice style delivering at [facility] discussed  8) Patient is asked about travel to areas at risk for the [place] virus, and counseled to avoid travel and exposure to mosquitoes or sexual partners who may have themselves been exposed to the virus. Testing is discussed, and will be ordered as appropriate.  9) Childbirth classes at [facility] advised 10) Genetic Screening, such as with 1st Trimester  Screening, cell free fetal DNA, AFP testing, and Ultrasound, as well as with amniocentesis and CVS as appropriate, is discussed with patient. She plans to have genetic testing this pregnancy. 11) [place] 2 weeks 12) Obesity RF discussed; nutrition discussion, early glucola scheduled; maintain BMI <40 as able  Problem list reviewed and updated.  Barnett Applebaum, MD, [facility], [facility] 03/31/2019  3:00 PM

## 2019-03-31 NOTE — Patient Instructions (Signed)
First Trimester of Pregnancy  The first trimester of pregnancy is from week 1 until the end of week 13 (months 1 through 3). A week after a sperm fertilizes an egg, the egg will implant on the wall of the uterus. This embryo will begin to develop into a baby. Genes from you and your partner will form the baby. The female genes will determine whether the baby will be a boy or a girl. At 6-8 weeks, the eyes and face will be formed, and the heartbeat can be seen on ultrasound. At the end of 12 weeks, all the baby's organs will be formed.  Now that you are pregnant, you will want to do everything you can to have a healthy baby. Two of the most important things are to get good prenatal care and to follow your health care provider's instructions. Prenatal care is all the medical care you receive before the baby's birth. This care will help prevent, find, and treat any problems during the pregnancy and childbirth.  Body changes during your first trimester  Your body goes through many changes during pregnancy. The changes vary from woman to woman.   You may gain or lose a couple of pounds at first.   You may feel sick to your stomach (nauseous) and you may throw up (vomit). If the vomiting is uncontrollable, call your health care provider.   You may tire easily.   You may develop headaches that can be relieved by medicines. All medicines should be approved by your health care provider.   You may urinate more often. Painful urination may mean you have a bladder infection.   You may develop heartburn as a result of your pregnancy.   You may develop constipation because certain hormones are causing the muscles that push stool through your intestines to slow down.   You may develop hemorrhoids or swollen veins (varicose veins).   Your breasts may begin to grow larger and become tender. Your nipples may stick out more, and the tissue that surrounds them (areola) may become darker.   Your gums may bleed and may be  sensitive to brushing and flossing.   Dark spots or blotches (chloasma, mask of pregnancy) may develop on your face. This will likely fade after the baby is born.   Your menstrual periods will stop.   You may have a loss of appetite.   You may develop cravings for certain kinds of food.   You may have changes in your emotions from day to day, such as being excited to be pregnant or being concerned that something may go wrong with the pregnancy and baby.   You may have more vivid and strange dreams.   You may have changes in your hair. These can include thickening of your hair, rapid growth, and changes in texture. Some women also have hair loss during or after pregnancy, or hair that feels dry or thin. Your hair will most likely return to normal after your baby is born.  What to expect at prenatal visits  During a routine prenatal visit:   You will be weighed to make sure you and the baby are growing normally.   Your blood pressure will be taken.   Your abdomen will be measured to track your baby's growth.   The fetal heartbeat will be listened to between weeks 10 and 14 of your pregnancy.   Test results from any previous visits will be discussed.  Your health care provider may ask you:     How you are feeling.   If you are feeling the baby move.   If you have had any abnormal symptoms, such as leaking fluid, bleeding, severe headaches, or abdominal cramping.   If you are using any tobacco products, including cigarettes, chewing tobacco, and electronic cigarettes.   If you have any questions.  Other tests that may be performed during your first trimester include:   Blood tests to find your blood type and to check for the presence of any previous infections. The tests will also be used to check for low iron levels (anemia) and protein on red blood cells (Rh antibodies). Depending on your risk factors, or if you previously had diabetes during pregnancy, you may have tests to check for high blood sugar  that affects pregnant women (gestational diabetes).   Urine tests to check for infections, diabetes, or protein in the urine.   An ultrasound to confirm the proper growth and development of the baby.   Fetal screens for spinal cord problems (spina bifida) and Down syndrome.   HIV (human immunodeficiency virus) testing. Routine prenatal testing includes screening for HIV, unless you choose not to have this test.   You may need other tests to make sure you and the baby are doing well.  Follow these instructions at home:  Medicines   Follow your health care provider's instructions regarding medicine use. Specific medicines may be either safe or unsafe to take during pregnancy.   Take a prenatal vitamin that contains at least 600 micrograms (mcg) of folic acid.   If you develop constipation, try taking a stool softener if your health care provider approves.  Eating and drinking     Eat a balanced diet that includes fresh fruits and vegetables, whole grains, good sources of protein such as meat, eggs, or tofu, and low-fat dairy. Your health care provider will help you determine the amount of weight gain that is right for you.   Avoid raw meat and uncooked cheese. These carry germs that can cause birth defects in the baby.   Eating four or five small meals rather than three large meals a day may help relieve nausea and vomiting. If you start to feel nauseous, eating a few soda crackers can be helpful. Drinking liquids between meals, instead of during meals, also seems to help ease nausea and vomiting.   Limit foods that are high in fat and processed sugars, such as fried and sweet foods.   To prevent constipation:  ? Eat foods that are high in fiber, such as fresh fruits and vegetables, whole grains, and beans.  ? Drink enough fluid to keep your urine clear or pale yellow.  Activity   Exercise only as directed by your health care provider. Most women can continue their usual exercise routine during  pregnancy. Try to exercise for 30 minutes at least 5 days a week. Exercising will help you:  ? Control your weight.  ? Stay in shape.  ? Be prepared for labor and delivery.   Experiencing pain or cramping in the lower abdomen or lower back is a good sign that you should stop exercising. Check with your health care provider before continuing with normal exercises.   Try to avoid standing for long periods of time. Move your legs often if you must stand in one place for a long time.   Avoid heavy lifting.   Wear low-heeled shoes and practice good posture.   You may continue to have sex unless your health care   provider tells you not to.  Relieving pain and discomfort   Wear a good support bra to relieve breast tenderness.   Take warm sitz baths to soothe any pain or discomfort caused by hemorrhoids. Use hemorrhoid cream if your health care provider approves.   Rest with your legs elevated if you have leg cramps or low back pain.   If you develop varicose veins in your legs, wear support hose. Elevate your feet for 15 minutes, 3-4 times a day. Limit salt in your diet.  Prenatal care   Schedule your prenatal visits by the twelfth week of pregnancy. They are usually scheduled monthly at first, then more often in the last 2 months before delivery.   Write down your questions. Take them to your prenatal visits.   Keep all your prenatal visits as told by your health care provider. This is important.  Safety   Wear your seat belt at all times when driving.   Make a list of emergency phone numbers, including numbers for family, friends, the hospital, and police and fire departments.  General instructions   Ask your health care provider for a referral to a local prenatal education class. Begin classes no later than the beginning of month 6 of your pregnancy.   Ask for help if you have counseling or nutritional needs during pregnancy. Your health care provider can offer advice or refer you to specialists for help  with various needs.   Do not use hot tubs, steam rooms, or saunas.   Do not douche or use tampons or scented sanitary pads.   Do not cross your legs for long periods of time.   Avoid cat litter boxes and soil used by cats. These carry germs that can cause birth defects in the baby and possibly loss of the fetus by miscarriage or stillbirth.   Avoid all smoking, herbs, alcohol, and medicines not prescribed by your health care provider. Chemicals in these products affect the formation and growth of the baby.   Do not use any products that contain nicotine or tobacco, such as cigarettes and e-cigarettes. If you need help quitting, ask your health care provider. You may receive counseling support and other resources to help you quit.   Schedule a dentist appointment. At home, brush your teeth with a soft toothbrush and be gentle when you floss.  Contact a health care provider if:   You have dizziness.   You have mild pelvic cramps, pelvic pressure, or nagging pain in the abdominal area.   You have persistent nausea, vomiting, or diarrhea.   You have a bad smelling vaginal discharge.   You have pain when you urinate.   You notice increased swelling in your face, hands, legs, or ankles.   You are exposed to fifth disease or chickenpox.   You are exposed to German measles (rubella) and have never had it.  Get help right away if:   You have a fever.   You are leaking fluid from your vagina.   You have spotting or bleeding from your vagina.   You have severe abdominal cramping or pain.   You have rapid weight gain or loss.   You vomit blood or material that looks like coffee grounds.   You develop a severe headache.   You have shortness of breath.   You have any kind of trauma, such as from a fall or a car accident.  Summary   The first trimester of pregnancy is from week 1 until   the end of week 13 (months 1 through 3).   Your body goes through many changes during pregnancy. The changes vary from  woman to woman.   You will have routine prenatal visits. During those visits, your health care provider will examine you, discuss any test results you may have, and talk with you about how you are feeling.  This information is not intended to replace advice given to you by your health care provider. Make sure you discuss any questions you have with your health care provider.  Document Released: 10/03/2001 Document Revised: 09/20/2016 Document Reviewed: 09/20/2016  Elsevier Interactive Patient Education  2019 [employer]vier Inc[employer]

## 2019-04-01 LAB — RPR+RH+ABO+RUB AB+AB SCR+CB...
Antibody Screen: NEGATIVE
HIV Screen 4th Generation wRfx: NONREACTIVE
Hematocrit: 37.2 % (ref 34.0–46.6)
Hemoglobin: 12.5 g/dL (ref 11.1–15.9)
Hepatitis B Surface Ag: NEGATIVE
MCH: 31 pg (ref 26.6–33.0)
MCHC: 33.6 g/dL (ref 31.5–35.7)
MCV: 92 fL (ref 79–97)
Platelets: 260 10*3/uL (ref 150–450)
RBC: 4.03 x10E6/uL (ref 3.77–5.28)
RDW: 12.2 % (ref 11.7–15.4)
RPR Ser Ql: NONREACTIVE
Rh Factor: NEGATIVE
Rubella Antibodies, IGG: 2.23 index (ref >0.99)
Varicella zoster IgG: 2195 index (ref >165)
WBC: 9.1 10*3/uL (ref 3.4–10.8)

## 2019-04-02 LAB — URINE CULTURE

## 2019-04-03 LAB — CYTOLOGY - PAP
Chlamydia: NEGATIVE
Diagnosis: NEGATIVE
HPV: NOT DETECTED
Neisseria Gonorrhea: NEGATIVE

## 2019-04-11 ENCOUNTER — Other Ambulatory Visit: Payer: Self-pay | Admitting: Obstetrics & Gynecology

## 2019-04-11 DIAGNOSIS — Z369 Encounter for antenatal screening, unspecified: Secondary | ICD-10-CM

## 2019-04-21 ENCOUNTER — Ambulatory Visit (INDEPENDENT_AMBULATORY_CARE_PROVIDER_SITE_OTHER)

## 2019-04-21 ENCOUNTER — Ambulatory Visit (INDEPENDENT_AMBULATORY_CARE_PROVIDER_SITE_OTHER): Admitting: Obstetrics & Gynecology

## 2019-04-21 ENCOUNTER — Encounter: Payer: Self-pay | Admitting: Obstetrics & Gynecology

## 2019-04-21 ENCOUNTER — Other Ambulatory Visit: Payer: Self-pay

## 2019-04-21 ENCOUNTER — Other Ambulatory Visit

## 2019-04-21 VITALS — BP 118/76 | Wt 207.0 lb

## 2019-04-21 DIAGNOSIS — Z3687 Encounter for antenatal screening for uncertain dates: Secondary | ICD-10-CM | POA: Diagnosis not present

## 2019-04-21 DIAGNOSIS — Z131 Encounter for screening for diabetes mellitus: Secondary | ICD-10-CM

## 2019-04-21 DIAGNOSIS — Z3490 Encounter for supervision of normal pregnancy, unspecified, unspecified trimester: Secondary | ICD-10-CM

## 2019-04-21 DIAGNOSIS — Z369 Encounter for antenatal screening, unspecified: Secondary | ICD-10-CM

## 2019-04-21 DIAGNOSIS — Z3A08 8 weeks gestation of pregnancy: Secondary | ICD-10-CM

## 2019-04-21 DIAGNOSIS — Z3491 Encounter for supervision of normal pregnancy, unspecified, first trimester: Secondary | ICD-10-CM

## 2019-04-21 LAB — POCT URINALYSIS DIPSTICK OB
Glucose, UA: NEGATIVE
POC,PROTEIN,UA: NEGATIVE

## 2019-04-21 NOTE — Progress Notes (Signed)
  HPI: Pt denies pain, bleeding, nausea.  She does report Fatigue.  Breast T.  Ultrasound demonstrates IUP w FHT, c/w EDC  PMHx: She  has a past medical history of GERD (gastroesophageal reflux disease) and Heart murmur. Also,  has a past surgical history that includes No past surgeries and Dilation and evacuation (N/A, 03/01/2017)., family history includes Hypertension in her father.,  reports that she has quit smoking. She has never used smokeless tobacco. She reports current alcohol use. She reports that she does not use drugs.  She has a current medication list which includes the following prescription(s): acetaminophen, calcium carbonate, and vitafol ultra. Also, is allergic to pollen extract.  Review of Systems  All other systems reviewed and are negative.   Objective: BP 118/76   Wt 207 lb (93.9 kg)   LMP 02/20/2019   BMI 37.86 kg/m   Physical examination Constitutional NAD, Conversant  Skin No rashes, lesions or ulceration.   Extremities: Moves all appropriately.  Normal ROM for age. No lymphadenopathy.  Neuro: Grossly intact  Psych: Oriented to PPT.  Normal mood. Normal affect.   Assessment:  [redacted] weeks gestation of pregnancy  Encounter for supervision of low-risk pregnancy, antepartum  PNV, B12 for energy NIPT 2 weeks Monitor for s/sx miscarriage, albeit rare moving forward Glucola today  Barnett Applebaum, MD, [facility], [facility] 04/21/2019  11:44 AM

## 2019-04-22 LAB — GLUCOSE, 1 HOUR GESTATIONAL: Gestational Diabetes Screen: 104 mg/dL (ref 65–139)

## 2019-05-05 ENCOUNTER — Encounter: Payer: Self-pay | Admitting: Obstetrics & Gynecology

## 2019-05-05 ENCOUNTER — Ambulatory Visit (INDEPENDENT_AMBULATORY_CARE_PROVIDER_SITE_OTHER): Admitting: Obstetrics and Gynecology

## 2019-05-05 ENCOUNTER — Other Ambulatory Visit: Payer: Self-pay

## 2019-05-05 ENCOUNTER — Other Ambulatory Visit: Payer: Self-pay | Admitting: Obstetrics & Gynecology

## 2019-05-05 VITALS — BP 126/74 | Wt 207.0 lb

## 2019-05-05 DIAGNOSIS — O219 Vomiting of pregnancy, unspecified: Secondary | ICD-10-CM

## 2019-05-05 DIAGNOSIS — Z1379 Encounter for other screening for genetic and chromosomal anomalies: Secondary | ICD-10-CM

## 2019-05-05 DIAGNOSIS — Z3A10 10 weeks gestation of pregnancy: Secondary | ICD-10-CM | POA: Diagnosis not present

## 2019-05-05 DIAGNOSIS — Z3490 Encounter for supervision of normal pregnancy, unspecified, unspecified trimester: Secondary | ICD-10-CM

## 2019-05-05 LAB — POCT URINALYSIS DIPSTICK OB
Glucose, UA: NEGATIVE
POC,PROTEIN,UA: NEGATIVE

## 2019-05-05 MED ORDER — CYANOCOBALAMIN 1000 MCG/ML IJ SOLN
1000.0000 ug | Freq: Once | INTRAMUSCULAR | Status: AC
  Administered 2019-05-05: 17:00:00 1000 ug via INTRAMUSCULAR

## 2019-05-05 MED ORDER — CYANOCOBALAMIN 1000 MCG/ML IJ SOLN: 1000.0000 ug | Freq: Once | INTRAMUSCULAR | 0 refills | Status: AC

## 2019-05-05 NOTE — Progress Notes (Signed)
ROB B12 injection given

## 2019-05-05 NOTE — Progress Notes (Signed)
    Routine Prenatal Care Visit  Subjective  Penny Jones is a 33 y.o. G4P1021 at [redacted]w[redacted]d being seen today for ongoing prenatal care.  She is currently monitored for the following issues for this low-risk pregnancy and has Encounter for supervision of low-risk pregnancy, antepartum; Missed abortion; and Infertility associated with anovulation on their problem list.  ----------------------------------------------------------------------------------- Patient reports no complaints.   Contractions: Not present. Vag. Bleeding: None.  Movement: Absent. Denies leaking of fluid.  ----------------------------------------------------------------------------------- The following portions of the patient's history were reviewed and updated as appropriate: allergies, current medications, past family history, past medical history, past social history, past surgical history and problem list. Problem list updated.   Objective  Blood pressure 126/74, weight 207 lb (93.9 kg), last menstrual period 02/20/2019, unknown if currently breastfeeding. Pregravid weight 200 lb (90.7 kg) Total Weight Gain 7 lb (3.175 kg) Urinalysis:      Fetal Status: Fetal Heart Rate (bpm): 155   Movement: Absent     General:  Alert, oriented and cooperative. Patient is in no acute distress.  Skin: Skin is warm and dry. No rash noted.   Cardiovascular: Normal heart rate noted  Respiratory: Normal respiratory effort, no problems with respiration noted  Abdomen: Soft, gravid, appropriate for gestational age. Pain/Pressure: Absent     Pelvic:  Cervical exam deferred        Extremities: Normal range of motion.     ental Status: Normal mood and affect. Normal behavior. Normal judgment and thought content.     Assessment   33 y.o. [id number] at [redacted]w[redacted]d by  11/27/2019, by Last Menstrual Period pesenting for routine prenatal visit  Plan   pregnancy4 Problems (from 02/20/19 to present)    Problem Noted Resolved   Encounter for  supervision of low-risk pregnancy, antepartum 01/02/2017 by Gae Dry, MD No   Overview Addendum 05/05/2019  4:28 PM by Malachy Mood, MD    [facility] Prenatal Labs  Dating LMP = 8 week [place] Blood type: A/Negative/-- (06/08 1507)   Genetic Screen 1 Screen:    AFP:     Quad:     NIPS: Antibody:Negative (06/08 1507)  Anatomic [place]  Rubella: 2.23 (06/08 1507) Varicella: Imm  GTT Early:               Third trimester:  RPR: Non Reactive (06/08 1507)   Rhogam  HBsAg: Negative (06/08 1507)   TDaP vaccine                       Flu Shot: HIV: Non Reactive (06/08 1507)   Baby Food                   Breast             GBS:   Contraception  Pap:03/31/19  CBB  No   CS/VBAC N/A   Support Person               Gestational age appropriate obstetric precautions including but not limited to vaginal bleeding, contractions, leaking of fluid and fetal movement were reviewed in detail with the patient.    - Maternity21 today  Return in about 4 weeks (around 06/02/2019) for Kay.  Malachy Mood, MD, [facility], [facility] 05/05/2019, 9:45 PM

## 2019-05-10 LAB — MATERNIT 21 PLUS CORE, BLOOD
Fetal Fraction: 5
Result (T21): NEGATIVE
Trisomy 13 (Patau syndrome): NEGATIVE
Trisomy 18 (Edwards syndrome): NEGATIVE
Trisomy 21 (Down syndrome): NEGATIVE

## 2019-05-18 ENCOUNTER — Encounter: Payer: Self-pay | Admitting: Obstetrics & Gynecology

## 2019-06-02 ENCOUNTER — Ambulatory Visit (INDEPENDENT_AMBULATORY_CARE_PROVIDER_SITE_OTHER): Admitting: Obstetrics & Gynecology

## 2019-06-02 ENCOUNTER — Encounter: Payer: Self-pay | Admitting: Obstetrics & Gynecology

## 2019-06-02 ENCOUNTER — Other Ambulatory Visit: Payer: Self-pay

## 2019-06-02 VITALS — BP 120/80 | Wt 206.0 lb

## 2019-06-02 DIAGNOSIS — Z3490 Encounter for supervision of normal pregnancy, unspecified, unspecified trimester: Secondary | ICD-10-CM

## 2019-06-02 DIAGNOSIS — Z3A14 14 weeks gestation of pregnancy: Secondary | ICD-10-CM

## 2019-06-02 DIAGNOSIS — Z3689 Encounter for other specified antenatal screening: Secondary | ICD-10-CM

## 2019-06-02 DIAGNOSIS — Z3482 Encounter for supervision of other normal pregnancy, second trimester: Secondary | ICD-10-CM

## 2019-06-02 LAB — POCT URINALYSIS DIPSTICK OB
Glucose, UA: NEGATIVE
POC,PROTEIN,UA: NEGATIVE

## 2019-06-02 NOTE — Progress Notes (Signed)
  Subjective  Fetal Movement? yes Contractions? no Leaking Fluid? no Vaginal Bleeding? no Min nausea, still has fatigue Objective  BP 120/80   Wt 206 lb (93.4 kg)   LMP 02/20/2019   BMI 37.68 kg/m  General: NAD Pumonary: no increased work of breathing Abdomen: gravid, non-tender Extremities: no edema Psychiatric: mood appropriate, affect full  Assessment  33 y.o. [id number] at [redacted]w[redacted]d by  11/27/2019, by Last Menstrual Period presenting for routine prenatal visit  Plan   Problem List Items Addressed This Visit      Other   Encounter for supervision of low-risk pregnancy, antepartum    Other Visit Diagnoses    [redacted] weeks gestation of pregnancy    -  Primary   Encounter for fetal anatomic survey       Relevant Orders   US OB Comp + 14 Wk    FHT reassuring today    [place] nv for anatomy scan PNV NIPT discussed  pregnancy4 Problems (from 02/20/19 to present)    Problem Noted Resolved   Encounter for supervision of low-risk pregnancy, antepartum 01/02/2017 by Gae Dry, MD No   Overview Addendum 05/12/2019  1:12 PM by Malachy Mood, MD    [facility] Prenatal Labs  Dating LMP = 8 week [place] Blood type: A/Negative/-- (06/08 1507)   Genetic Screen NIPS: Normal XX Antibody:Negative (06/08 1507)  Anatomic [place] planned Rubella: 2.23 (06/08 1507) Varicella: Imm  GTT 28 wks  RPR: Non Reactive (06/08 1507)   Rhogam 28 wks HBsAg: Negative (06/08 1507)   TDaP vaccine 30 wks   Flu Shot: oct20 HIV: Non Reactive (06/08 1507)   Baby Food                   Breast             GBS:36 wks   Contraception None planned Pap:03/31/19  CBB  No   CS/VBAC N/A   Support Person Husband               Barnett Applebaum, MD, [facility], [facility] 06/02/2019  8:44 AM

## 2019-06-02 NOTE — Addendum Note (Signed)
Addended by: Quintella Baton D on: 06/02/2019 08:54 AM   Modules accepted: Orders

## 2019-06-02 NOTE — Patient Instructions (Signed)

## 2019-06-06 ENCOUNTER — Encounter: Payer: Self-pay | Admitting: Obstetrics & Gynecology

## 2019-07-09 ENCOUNTER — Ambulatory Visit (INDEPENDENT_AMBULATORY_CARE_PROVIDER_SITE_OTHER)

## 2019-07-09 ENCOUNTER — Ambulatory Visit (INDEPENDENT_AMBULATORY_CARE_PROVIDER_SITE_OTHER): Admitting: Obstetrics & Gynecology

## 2019-07-09 ENCOUNTER — Other Ambulatory Visit: Payer: Self-pay

## 2019-07-09 ENCOUNTER — Encounter: Payer: Self-pay | Admitting: Obstetrics & Gynecology

## 2019-07-09 VITALS — BP 120/80 | Wt 209.0 lb

## 2019-07-09 DIAGNOSIS — Z3689 Encounter for other specified antenatal screening: Secondary | ICD-10-CM

## 2019-07-09 DIAGNOSIS — Z363 Encounter for antenatal screening for malformations: Secondary | ICD-10-CM

## 2019-07-09 DIAGNOSIS — Z3490 Encounter for supervision of normal pregnancy, unspecified, unspecified trimester: Secondary | ICD-10-CM

## 2019-07-09 DIAGNOSIS — Z3482 Encounter for supervision of other normal pregnancy, second trimester: Secondary | ICD-10-CM

## 2019-07-09 DIAGNOSIS — Z3A19 19 weeks gestation of pregnancy: Secondary | ICD-10-CM

## 2019-07-09 NOTE — Progress Notes (Signed)
  Subjective  Fetal Movement? yes Contractions? no Leaking Fluid? no Vaginal Bleeding? no  Objective  BP 120/80   Wt 209 lb (94.8 kg)   LMP 02/20/2019   BMI 38.23 kg/m  General: NAD Pumonary: no increased work of breathing Abdomen: gravid, non-tender Extremities: no edema Psychiatric: mood appropriate, affect full  Assessment  33 y.o. [id number] at [redacted]w[redacted]d by  11/27/2019, by Last Menstrual Period presenting for routine prenatal visit  Plan   Problem List Items Addressed This Visit      Other   Encounter for supervision of low-risk pregnancy, antepartum    Other Visit Diagnoses    [redacted] weeks gestation of pregnancy    -  Primary      pregnancy4 Problems (from 02/20/19 to present)    Problem Noted Resolved   Encounter for supervision of low-risk pregnancy, antepartum 01/02/2017 by Gae Dry, MD No   Overview Addendum 06/02/2019  8:47 AM by Gae Dry, MD    [facility] Prenatal Labs  Dating LMP = 8 week [place] Blood type: A/Negative/-- (06/08 1507)   Genetic Screen NIPS: Normal XX Antibody:Negative (06/08 1507)  Anatomic [place] WSOG Rubella: 2.23 (06/08 1507) Varicella: Imm  GTT Third trimester:  RPR: Non Reactive (06/08 1507)   Rhogam Planned [ ]  HBsAg: Negative (06/08 1507)   TDaP vaccine Planned [ ]  Flu Shot:  [declined ] HIV: Non Reactive (06/08 1507)   Baby Food Breast             GBS:   Contraception H/o infertility, counseled all options Pap:03/31/19  CBB  No   CS/VBAC N/A   Support Person Husband             Low Lying placenta discussed, plan [place] 28 weeks to recheck  Barnett Applebaum, MD, [place], [facility] 07/09/2019  9:19 AM

## 2019-07-09 NOTE — Patient Instructions (Signed)

## 2019-07-30 ENCOUNTER — Encounter: Payer: Self-pay | Admitting: Obstetrics & Gynecology

## 2019-08-06 ENCOUNTER — Encounter: Payer: Self-pay | Admitting: Obstetrics & Gynecology

## 2019-08-06 ENCOUNTER — Other Ambulatory Visit: Payer: Self-pay

## 2019-08-06 ENCOUNTER — Ambulatory Visit (INDEPENDENT_AMBULATORY_CARE_PROVIDER_SITE_OTHER): Admitting: Obstetrics & Gynecology

## 2019-08-06 VITALS — Wt 213.0 lb

## 2019-08-06 DIAGNOSIS — O4440 Low lying placenta NOS or without hemorrhage, unspecified trimester: Secondary | ICD-10-CM

## 2019-08-06 DIAGNOSIS — O4442 Low lying placenta NOS or without hemorrhage, second trimester: Secondary | ICD-10-CM

## 2019-08-06 DIAGNOSIS — Z131 Encounter for screening for diabetes mellitus: Secondary | ICD-10-CM

## 2019-08-06 DIAGNOSIS — Z3A23 23 weeks gestation of pregnancy: Secondary | ICD-10-CM

## 2019-08-06 DIAGNOSIS — Z3490 Encounter for supervision of normal pregnancy, unspecified, unspecified trimester: Secondary | ICD-10-CM

## 2019-08-06 NOTE — Progress Notes (Signed)
Virtual Visit via Telephone Note  I connected with patient on 08/06/19 at  3:10 PM EDT by telephone and verified that I am speaking with the correct person using two identifiers.   I discussed the limitations, risks, security and privacy concerns of performing an evaluation and management service by telephone and the availability of in person appointments. I also discussed with the patient that there may be a patient responsible charge related to this service. The patient expressed understanding and agreed to proceed.  The patient was at home I spoke with the patient from my  office  Penny Jones is a 33 y.o. [phone] at [redacted]w[redacted]d being seen today for ongoing prenatal care.  She is currently monitored for the following issues for this low-risk pregnancy and has Encounter for supervision of low-risk pregnancy, antepartum; Missed abortion; and Infertility associated with anovulation on their problem list.  ----------------------------------------------------------------------------------- Patient reports no complaints.   Denies pain, VB, leaking of fluid.  ----------------------------------------------------------------------------------- The following portions of the patient's history were reviewed and updated as appropriate: allergies, current medications, past family history, past medical history, past social history, past surgical history and problem list. Problem list updated.   Objective  Weight 213 lb (96.6 kg), last menstrual period 02/20/2019, unknown if currently breastfeeding. Pregravid weight 200 lb (90.7 kg) Total Weight Gain 13 lb (5.897 kg)  Physical Exam could not be performed. Because of the COVID-19 outbreak this visit was performed over the phone and not in person.   Assessment   33 y.o. [id number] at [redacted]w[redacted]d by  11/27/2019, by Last Menstrual Period presenting for routine prenatal visit  Plan   pregnancy4 Problems (from 02/20/19 to present)    Problem Noted Resolved   Encounter  for supervision of low-risk pregnancy, antepartum 01/02/2017 by Gae Dry, MD No   Overview Addendum 07/09/2019  9:20 AM by Gae Dry, MD    [facility] Prenatal Labs  Dating LMP = 8 week [place] Blood type: A/Negative/-- (06/08 1507)   Genetic Screen NIPS: Normal XX Antibody:Negative (06/08 1507)  Anatomic [place] WSOG Rubella: 2.23 (06/08 1507) Varicella: Imm  GTT Third trimester:  RPR: Non Reactive (06/08 1507)   Rhogam Planned [ ]  HBsAg: Negative (06/08 1507)   TDaP vaccine Planned [ ]  Flu Shot: declined HIV: Non Reactive (06/08 1507)   Baby Food Breast             GBS: planned  Contraception H/o infertility, counseled all options Pap:03/31/19  CBB  No   CS/VBAC N/A   Support Person Husband             PNV  Glucola nv  [place] for placenta nv  Gestational age appropriate obstetric precautions including but not limited to vaginal bleeding, contractions, leaking of fluid and fetal movement were reviewed in detail with the patient.     Follow Up Instructions: 4 weeks for glucola and [place]   I discussed the assessment and treatment plan with the patient. The patient was provided an opportunity to ask questions and all were answered. The patient agreed with the plan and demonstrated an understanding of the instructions.   The patient was advised to call back or seek an in-person evaluation if the symptoms worsen or if the condition fails to improve as anticipated.  I provided 8 minutes of non-face-to-face time during this encounter.  Return in about 4 weeks (around 09/03/2019) for ROB w [place] w glc.  Barnett Applebaum, MD [facility], [facility] 08/06/2019 3:24 PM

## 2019-08-07 ENCOUNTER — Other Ambulatory Visit: Payer: Self-pay

## 2019-08-07 ENCOUNTER — Encounter: Payer: Self-pay | Admitting: Obstetrics & Gynecology

## 2019-09-03 ENCOUNTER — Ambulatory Visit (INDEPENDENT_AMBULATORY_CARE_PROVIDER_SITE_OTHER)

## 2019-09-03 ENCOUNTER — Ambulatory Visit (INDEPENDENT_AMBULATORY_CARE_PROVIDER_SITE_OTHER): Admitting: Obstetrics & Gynecology

## 2019-09-03 ENCOUNTER — Other Ambulatory Visit

## 2019-09-03 ENCOUNTER — Encounter: Payer: Self-pay | Admitting: Obstetrics & Gynecology

## 2019-09-03 ENCOUNTER — Other Ambulatory Visit: Payer: Self-pay

## 2019-09-03 VITALS — BP 120/80 | Wt 218.0 lb

## 2019-09-03 DIAGNOSIS — O4442 Low lying placenta NOS or without hemorrhage, second trimester: Secondary | ICD-10-CM

## 2019-09-03 DIAGNOSIS — Z3A27 27 weeks gestation of pregnancy: Secondary | ICD-10-CM | POA: Diagnosis not present

## 2019-09-03 DIAGNOSIS — O26892 Other specified pregnancy related conditions, second trimester: Secondary | ICD-10-CM

## 2019-09-03 DIAGNOSIS — O4440 Low lying placenta NOS or without hemorrhage, unspecified trimester: Secondary | ICD-10-CM

## 2019-09-03 DIAGNOSIS — Z6791 Unspecified blood type, Rh negative: Secondary | ICD-10-CM

## 2019-09-03 DIAGNOSIS — Z3490 Encounter for supervision of normal pregnancy, unspecified, unspecified trimester: Secondary | ICD-10-CM

## 2019-09-03 DIAGNOSIS — Z131 Encounter for screening for diabetes mellitus: Secondary | ICD-10-CM

## 2019-09-03 LAB — POCT URINALYSIS DIPSTICK OB
Glucose, UA: NEGATIVE
POC,PROTEIN,UA: NEGATIVE

## 2019-09-03 MED ORDER — RHO D IMMUNE GLOBULIN 1500 UNIT/2ML IJ SOSY
300.0000 ug | PREFILLED_SYRINGE | Freq: Once | INTRAMUSCULAR | Status: AC
  Administered 2019-09-03: 300 ug via INTRAMUSCULAR

## 2019-09-03 NOTE — Addendum Note (Signed)
Addended by: Quintella Baton D on: 09/03/2019 10:10 AM   Modules accepted: Orders

## 2019-09-03 NOTE — Patient Instructions (Signed)
Third Trimester of Pregnancy The third trimester is from week 28 through week 40 (months 7 through 9). The third trimester is a time when the unborn baby (fetus) is growing rapidly. At the end of the ninth month, the fetus is about 20 inches in length and weighs 6-10 pounds. Body changes during your third trimester Your body will continue to go through many changes during pregnancy. The changes vary from woman to woman. During the third trimester:  Your weight will continue to increase. You can expect to gain 25-35 pounds (11-16 kg) by the end of the pregnancy.  You may begin to get stretch marks on your hips, abdomen, and breasts.  You may urinate more often because the fetus is moving lower into your pelvis and pressing on your bladder.  You may develop or continue to have heartburn. This is caused by increased hormones that slow down muscles in the digestive tract.  You may develop or continue to have constipation because increased hormones slow digestion and cause the muscles that push waste through your intestines to relax.  You may develop hemorrhoids. These are swollen veins (varicose veins) in the rectum that can itch or be painful.  You may develop swollen, bulging veins (varicose veins) in your legs.  You may have increased body aches in the pelvis, back, or thighs. This is due to weight gain and increased hormones that are relaxing your joints.  You may have changes in your hair. These can include thickening of your hair, rapid growth, and changes in texture. Some women also have hair loss during or after pregnancy, or hair that feels dry or thin. Your hair will most likely return to normal after your baby is born.  Your breasts will continue to grow and they will continue to become tender. A yellow fluid (colostrum) may leak from your breasts. This is the first milk you are producing for your baby.  Your belly button may stick out.  You may notice more swelling in your hands,  face, or ankles.  You may have increased tingling or numbness in your hands, arms, and legs. The skin on your belly may also feel numb.  You may feel short of breath because of your expanding uterus.  You may have more problems sleeping. This can be caused by the size of your belly, increased need to urinate, and an increase in your body's metabolism.  You may notice the fetus "dropping," or moving lower in your abdomen (lightening).  You may have increased vaginal discharge.  You may notice your joints feel loose and you may have pain around your pelvic bone. What to expect at prenatal visits You will have prenatal exams every 2 weeks until week 36. Then you will have weekly prenatal exams. During a routine prenatal visit:  You will be weighed to make sure you and the baby are growing normally.  Your blood pressure will be taken.  Your abdomen will be measured to track your baby's growth.  The fetal heartbeat will be listened to.  Any test results from the previous visit will be discussed.  You may have a cervical check near your due date to see if your cervix has softened or thinned (effaced).  You will be tested for Group B streptococcus. This happens between 35 and 37 weeks. Your health care provider may ask you:  What your birth plan is.  How you are feeling.  If you are feeling the baby move.  If you have had any abnormal   symptoms, such as leaking fluid, bleeding, severe headaches, or abdominal cramping.  If you are using any tobacco products, including cigarettes, chewing tobacco, and electronic cigarettes.  If you have any questions. Other tests or screenings that may be performed during your third trimester include:  Blood tests that check for low iron levels (anemia).  Fetal testing to check the health, activity level, and growth of the fetus. Testing is done if you have certain medical conditions or if there are problems during the pregnancy.  Nonstress test  (NST). This test checks the health of your baby to make sure there are no signs of problems, such as the baby not getting enough oxygen. During this test, a belt is placed around your belly. The baby is made to move, and its heart rate is monitored during movement. What is false labor? False labor is a condition in which you feel small, irregular tightenings of the muscles in the womb (contractions) that usually go away with rest, changing position, or drinking water. These are called Braxton Hicks contractions. Contractions may last for hours, days, or even weeks before true labor sets in. If contractions come at regular intervals, become more frequent, increase in intensity, or become painful, you should see your health care provider. What are the signs of labor?  Abdominal cramps.  Regular contractions that start at 10 minutes apart and become stronger and more frequent with time.  Contractions that start on the top of the uterus and spread down to the lower abdomen and back.  Increased pelvic pressure and dull back pain.  A watery or bloody mucus discharge that comes from the vagina.  Leaking of amniotic fluid. This is also known as your "water breaking." It could be a slow trickle or a gush. Let your health care provider know if it has a color or strange odor. If you have any of these signs, call your health care provider right away, even if it is before your due date. Follow these instructions at home: Medicines  Follow your health care provider's instructions regarding medicine use. Specific medicines may be either safe or unsafe to take during pregnancy.  Take a prenatal vitamin that contains at least 600 micrograms (mcg) of folic acid.  If you develop constipation, try taking a stool softener if your health care provider approves. Eating and drinking   Eat a balanced diet that includes fresh fruits and vegetables, whole grains, good sources of protein such as meat, eggs, or tofu,  and low-fat dairy. Your health care provider will help you determine the amount of weight gain that is right for you.  Avoid raw meat and uncooked cheese. These carry germs that can cause birth defects in the baby.  If you have low calcium intake from food, talk to your health care provider about whether you should take a daily calcium supplement.  Eat four or five small meals rather than three large meals a day.  Limit foods that are high in fat and processed sugars, such as fried and sweet foods.  To prevent constipation: ? Drink enough fluid to keep your urine clear or pale yellow. ? Eat foods that are high in fiber, such as fresh fruits and vegetables, whole grains, and beans. Activity  Exercise only as directed by your health care provider. Most women can continue their usual exercise routine during pregnancy. Try to exercise for 30 minutes at least 5 days a week. Stop exercising if you experience uterine contractions.  Avoid heavy lifting.  Do   not exercise in extreme heat or humidity, or at high altitudes.  Wear low-heel, comfortable shoes.  Practice good posture.  You may continue to have sex unless your health care provider tells you otherwise. Relieving pain and discomfort  Take frequent breaks and rest with your legs elevated if you have leg cramps or low back pain.  Take warm sitz baths to soothe any pain or discomfort caused by hemorrhoids. Use hemorrhoid cream if your health care provider approves.  Wear a good support bra to prevent discomfort from breast tenderness.  If you develop varicose veins: ? Wear support pantyhose or compression stockings as told by your healthcare provider. ? Elevate your feet for 15 minutes, 3-4 times a day. Prenatal care  Write down your questions. Take them to your prenatal visits.  Keep all your prenatal visits as told by your health care provider. This is important. Safety  Wear your seat belt at all times when driving.  Make  a list of emergency phone numbers, including numbers for family, friends, the hospital, and police and fire departments. General instructions  Avoid cat litter boxes and soil used by cats. These carry germs that can cause birth defects in the baby. If you have a cat, ask someone to clean the litter box for you.  Do not travel far distances unless it is absolutely necessary and only with the approval of your health care provider.  Do not use hot tubs, steam rooms, or saunas.  Do not drink alcohol.  Do not use any products that contain nicotine or tobacco, such as cigarettes and e-cigarettes. If you need help quitting, ask your health care provider.  Do not use any medicinal herbs or unprescribed drugs. These chemicals affect the formation and growth of the baby.  Do not douche or use tampons or scented sanitary pads.  Do not cross your legs for long periods of time.  To prepare for the arrival of your baby: ? Take prenatal classes to understand, practice, and ask questions about labor and delivery. ? Make a trial run to the hospital. ? Visit the hospital and tour the maternity area. ? Arrange for maternity or paternity leave through employers. ? Arrange for family and friends to take care of pets while you are in the hospital. ? Purchase a rear-facing car seat and make sure you know how to install it in your car. ? Pack your hospital bag. ? Prepare the baby's nursery. Make sure to remove all pillows and stuffed animals from the baby's crib to prevent suffocation.  Visit your dentist if you have not gone during your pregnancy. Use a soft toothbrush to brush your teeth and be gentle when you floss. Contact a health care provider if:  You are unsure if you are in labor or if your water has broken.  You become dizzy.  You have mild pelvic cramps, pelvic pressure, or nagging pain in your abdominal area.  You have lower back pain.  You have persistent nausea, vomiting, or diarrhea.   You have an unusual or bad smelling vaginal discharge.  You have pain when you urinate. Get help right away if:  Your water breaks before 37 weeks.  You have regular contractions less than 5 minutes apart before 37 weeks.  You have a fever.  You are leaking fluid from your vagina.  You have spotting or bleeding from your vagina.  You have severe abdominal pain or cramping.  You have rapid weight loss or weight gain.  You have   shortness of breath with chest pain.  You notice sudden or extreme swelling of your face, hands, ankles, feet, or legs.  Your baby makes fewer than 10 movements in 2 hours.  You have severe headaches that do not go away when you take medicine.  You have vision changes. Summary  The third trimester is from week 28 through week 40, months 7 through 9. The third trimester is a time when the unborn baby (fetus) is growing rapidly.  During the third trimester, your discomfort may increase as you and your baby continue to gain weight. You may have abdominal, leg, and back pain, sleeping problems, and an increased need to urinate.  During the third trimester your breasts will keep growing and they will continue to become tender. A yellow fluid (colostrum) may leak from your breasts. This is the first milk you are producing for your baby.  False labor is a condition in which you feel small, irregular tightenings of the muscles in the womb (contractions) that eventually go away. These are called Braxton Hicks contractions. Contractions may last for hours, days, or even weeks before true labor sets in.  Signs of labor can include: abdominal cramps; regular contractions that start at 10 minutes apart and become stronger and more frequent with time; watery or bloody mucus discharge that comes from the vagina; increased pelvic pressure and dull back pain; and leaking of amniotic fluid. This information is not intended to replace advice given to you by your health  care provider. Make sure you discuss any questions you have with your health care provider. Document Released: 10/03/2001 Document Revised: 01/30/2019 Document Reviewed: 11/14/2016 Elsevier Patient Education  2020 [employer]  

## 2019-09-03 NOTE — Addendum Note (Signed)
Addended by: Quintella Baton D on: 09/03/2019 10:45 AM   Modules accepted: Orders

## 2019-09-03 NOTE — Progress Notes (Signed)
  Subjective  Fetal Movement? yes Contractions? no Leaking Fluid? no Vaginal Bleeding? no  Objective  BP 120/80   Wt 218 lb (98.9 kg)   LMP 02/20/2019   BMI 39.87 kg/m  General: NAD Pumonary: no increased work of breathing Abdomen: gravid, non-tender Extremities: no edema Psychiatric: mood appropriate, affect full  Assessment  33 y.o. [id number] at [redacted]w[redacted]d by  11/27/2019, by Last Menstrual Period presenting for routine prenatal visit  Plan   Problem List Items Addressed This Visit      Other   Encounter for supervision of low-risk pregnancy, antepartum    Other Visit Diagnoses    [redacted] weeks gestation of pregnancy    -  Primary      pregnancy4 Problems (from 02/20/19 to present)    Problem Noted Resolved   Encounter for supervision of low-risk pregnancy, antepartum 01/02/2017 by Gae Dry, MD No   Overview Addendum 07/09/2019  9:20 AM by Gae Dry, MD    [facility] Prenatal Labs  Dating LMP = 8 week [place] Blood type: A/Negative/-- (06/08 1507)   Genetic Screen NIPS: Normal XX Antibody:Negative (06/08 1507)  Anatomic [place] WSOG Rubella: 2.23 (06/08 1507) Varicella: Imm  GTT Third trimester:  RPR: Non Reactive (06/08 1507)   Rhogam Planned [ ]  HBsAg: Negative (06/08 1507)   TDaP vaccine Planned [ ]  Flu Shot: declined HIV: Non Reactive (06/08 1507)   Baby Food Breast             GBS:   Contraception H/o infertility, counseled all options Pap:03/31/19  CBB  No   CS/VBAC N/A   Support Person Husband             Review of ULTRASOUND.    I have personally reviewed images and report of recent ultrasound done at [facility].    Plan of management to be discussed with patient.    Placenta 3 cm from cervical os, resolved low lying placenta  Rhogam today Glucola today  Barnett Applebaum, MD, [facility], [facility] 09/03/2019  10:06 AM

## 2019-09-04 LAB — 28 WEEKS RH-PANEL
Antibody Screen: NEGATIVE
Basophils Absolute: 0 10*3/uL (ref 0.0–0.2)
Basos: 0 %
EOS (ABSOLUTE): 0.1 10*3/uL (ref 0.0–0.4)
Eos: 1 %
Gestational Diabetes Screen: 133 mg/dL (ref 65–139)
HIV Screen 4th Generation wRfx: NONREACTIVE
Hematocrit: 32.4 % — ABNORMAL LOW (ref 34.0–46.6)
Hemoglobin: 10.8 g/dL — ABNORMAL LOW (ref 11.1–15.9)
Immature Grans (Abs): 0.1 10*3/uL (ref 0.0–0.1)
Immature Granulocytes: 1 %
Lymphocytes Absolute: 1.5 10*3/uL (ref 0.7–3.1)
Lymphs: 17 %
MCH: 29.4 pg (ref 26.6–33.0)
MCHC: 33.3 g/dL (ref 31.5–35.7)
MCV: 88 fL (ref 79–97)
Monocytes Absolute: 0.4 10*3/uL (ref 0.1–0.9)
Monocytes: 4 %
Neutrophils Absolute: 6.7 10*3/uL (ref 1.4–7.0)
Neutrophils: 77 %
Platelets: 189 10*3/uL (ref 150–450)
RBC: 3.67 x10E6/uL — ABNORMAL LOW (ref 3.77–5.28)
RDW: 12.8 % (ref 11.7–15.4)
RPR Ser Ql: NONREACTIVE
WBC: 8.7 10*3/uL (ref 3.4–10.8)

## 2019-09-17 ENCOUNTER — Ambulatory Visit (INDEPENDENT_AMBULATORY_CARE_PROVIDER_SITE_OTHER): Admitting: Obstetrics & Gynecology

## 2019-09-17 ENCOUNTER — Other Ambulatory Visit: Payer: Self-pay

## 2019-09-17 VITALS — BP 120/80 | Wt 222.0 lb

## 2019-09-17 DIAGNOSIS — Z3493 Encounter for supervision of normal pregnancy, unspecified, third trimester: Secondary | ICD-10-CM

## 2019-09-17 DIAGNOSIS — Z23 Encounter for immunization: Secondary | ICD-10-CM | POA: Diagnosis not present

## 2019-09-17 DIAGNOSIS — Z3490 Encounter for supervision of normal pregnancy, unspecified, unspecified trimester: Secondary | ICD-10-CM

## 2019-09-17 DIAGNOSIS — Z3A29 29 weeks gestation of pregnancy: Secondary | ICD-10-CM

## 2019-09-17 NOTE — Patient Instructions (Signed)
Third Trimester of Pregnancy The third trimester is from week 28 through week 40 (months 7 through 9). The third trimester is a time when the unborn baby (fetus) is growing rapidly. At the end of the ninth month, the fetus is about 20 inches in length and weighs 6-10 pounds. Body changes during your third trimester Your body will continue to go through many changes during pregnancy. The changes vary from woman to woman. During the third trimester:  Your weight will continue to increase. You can expect to gain 25-35 pounds (11-16 kg) by the end of the pregnancy.  You may begin to get stretch marks on your hips, abdomen, and breasts.  You may urinate more often because the fetus is moving lower into your pelvis and pressing on your bladder.  You may develop or continue to have heartburn. This is caused by increased hormones that slow down muscles in the digestive tract.  You may develop or continue to have constipation because increased hormones slow digestion and cause the muscles that push waste through your intestines to relax.  You may develop hemorrhoids. These are swollen veins (varicose veins) in the rectum that can itch or be painful.  You may develop swollen, bulging veins (varicose veins) in your legs.  You may have increased body aches in the pelvis, back, or thighs. This is due to weight gain and increased hormones that are relaxing your joints.  You may have changes in your hair. These can include thickening of your hair, rapid growth, and changes in texture. Some women also have hair loss during or after pregnancy, or hair that feels dry or thin. Your hair will most likely return to normal after your baby is born.  Your breasts will continue to grow and they will continue to become tender. A yellow fluid (colostrum) may leak from your breasts. This is the first milk you are producing for your baby.  Your belly button may stick out.  You may notice more swelling in your hands,  face, or ankles.  You may have increased tingling or numbness in your hands, arms, and legs. The skin on your belly may also feel numb.  You may feel short of breath because of your expanding uterus.  You may have more problems sleeping. This can be caused by the size of your belly, increased need to urinate, and an increase in your body's metabolism.  You may notice the fetus "dropping," or moving lower in your abdomen (lightening).  You may have increased vaginal discharge.  You may notice your joints feel loose and you may have pain around your pelvic bone. What to expect at prenatal visits You will have prenatal exams every 2 weeks until week 36. Then you will have weekly prenatal exams. During a routine prenatal visit:  You will be weighed to make sure you and the baby are growing normally.  Your blood pressure will be taken.  Your abdomen will be measured to track your baby's growth.  The fetal heartbeat will be listened to.  Any test results from the previous visit will be discussed.  You may have a cervical check near your due date to see if your cervix has softened or thinned (effaced).  You will be tested for Group B streptococcus. This happens between 35 and 37 weeks. Your health care provider may ask you:  What your birth plan is.  How you are feeling.  If you are feeling the baby move.  If you have had any abnormal   symptoms, such as leaking fluid, bleeding, severe headaches, or abdominal cramping.  If you are using any tobacco products, including cigarettes, chewing tobacco, and electronic cigarettes.  If you have any questions. Other tests or screenings that may be performed during your third trimester include:  Blood tests that check for low iron levels (anemia).  Fetal testing to check the health, activity level, and growth of the fetus. Testing is done if you have certain medical conditions or if there are problems during the pregnancy.  Nonstress test  (NST). This test checks the health of your baby to make sure there are no signs of problems, such as the baby not getting enough oxygen. During this test, a belt is placed around your belly. The baby is made to move, and its heart rate is monitored during movement. What is false labor? False labor is a condition in which you feel small, irregular tightenings of the muscles in the womb (contractions) that usually go away with rest, changing position, or drinking water. These are called Braxton Hicks contractions. Contractions may last for hours, days, or even weeks before true labor sets in. If contractions come at regular intervals, become more frequent, increase in intensity, or become painful, you should see your health care provider. What are the signs of labor?  Abdominal cramps.  Regular contractions that start at 10 minutes apart and become stronger and more frequent with time.  Contractions that start on the top of the uterus and spread down to the lower abdomen and back.  Increased pelvic pressure and dull back pain.  A watery or bloody mucus discharge that comes from the vagina.  Leaking of amniotic fluid. This is also known as your "water breaking." It could be a slow trickle or a gush. Let your health care provider know if it has a color or strange odor. If you have any of these signs, call your health care provider right away, even if it is before your due date. Follow these instructions at home: Medicines  Follow your health care provider's instructions regarding medicine use. Specific medicines may be either safe or unsafe to take during pregnancy.  Take a prenatal vitamin that contains at least 600 micrograms (mcg) of folic acid.  If you develop constipation, try taking a stool softener if your health care provider approves. Eating and drinking   Eat a balanced diet that includes fresh fruits and vegetables, whole grains, good sources of protein such as meat, eggs, or tofu,  and low-fat dairy. Your health care provider will help you determine the amount of weight gain that is right for you.  Avoid raw meat and uncooked cheese. These carry germs that can cause birth defects in the baby.  If you have low calcium intake from food, talk to your health care provider about whether you should take a daily calcium supplement.  Eat four or five small meals rather than three large meals a day.  Limit foods that are high in fat and processed sugars, such as fried and sweet foods.  To prevent constipation: ? Drink enough fluid to keep your urine clear or pale yellow. ? Eat foods that are high in fiber, such as fresh fruits and vegetables, whole grains, and beans. Activity  Exercise only as directed by your health care provider. Most women can continue their usual exercise routine during pregnancy. Try to exercise for 30 minutes at least 5 days a week. Stop exercising if you experience uterine contractions.  Avoid heavy lifting.  Do   not exercise in extreme heat or humidity, or at high altitudes.  Wear low-heel, comfortable shoes.  Practice good posture.  You may continue to have sex unless your health care provider tells you otherwise. Relieving pain and discomfort  Take frequent breaks and rest with your legs elevated if you have leg cramps or low back pain.  Take warm sitz baths to soothe any pain or discomfort caused by hemorrhoids. Use hemorrhoid cream if your health care provider approves.  Wear a good support bra to prevent discomfort from breast tenderness.  If you develop varicose veins: ? Wear support pantyhose or compression stockings as told by your healthcare provider. ? Elevate your feet for 15 minutes, 3-4 times a day. Prenatal care  Write down your questions. Take them to your prenatal visits.  Keep all your prenatal visits as told by your health care provider. This is important. Safety  Wear your seat belt at all times when driving.  Make  a list of emergency phone numbers, including numbers for family, friends, the hospital, and police and fire departments. General instructions  Avoid cat litter boxes and soil used by cats. These carry germs that can cause birth defects in the baby. If you have a cat, ask someone to clean the litter box for you.  Do not travel far distances unless it is absolutely necessary and only with the approval of your health care provider.  Do not use hot tubs, steam rooms, or saunas.  Do not drink alcohol.  Do not use any products that contain nicotine or tobacco, such as cigarettes and e-cigarettes. If you need help quitting, ask your health care provider.  Do not use any medicinal herbs or unprescribed drugs. These chemicals affect the formation and growth of the baby.  Do not douche or use tampons or scented sanitary pads.  Do not cross your legs for long periods of time.  To prepare for the arrival of your baby: ? Take prenatal classes to understand, practice, and ask questions about labor and delivery. ? Make a trial run to the hospital. ? Visit the hospital and tour the maternity area. ? Arrange for maternity or paternity leave through employers. ? Arrange for family and friends to take care of pets while you are in the hospital. ? Purchase a rear-facing car seat and make sure you know how to install it in your car. ? Pack your hospital bag. ? Prepare the baby's nursery. Make sure to remove all pillows and stuffed animals from the baby's crib to prevent suffocation.  Visit your dentist if you have not gone during your pregnancy. Use a soft toothbrush to brush your teeth and be gentle when you floss. Contact a health care provider if:  You are unsure if you are in labor or if your water has broken.  You become dizzy.  You have mild pelvic cramps, pelvic pressure, or nagging pain in your abdominal area.  You have lower back pain.  You have persistent nausea, vomiting, or diarrhea.   You have an unusual or bad smelling vaginal discharge.  You have pain when you urinate. Get help right away if:  Your water breaks before 37 weeks.  You have regular contractions less than 5 minutes apart before 37 weeks.  You have a fever.  You are leaking fluid from your vagina.  You have spotting or bleeding from your vagina.  You have severe abdominal pain or cramping.  You have rapid weight loss or weight gain.  You have   shortness of breath with chest pain.  You notice sudden or extreme swelling of your face, hands, ankles, feet, or legs.  Your baby makes fewer than 10 movements in 2 hours.  You have severe headaches that do not go away when you take medicine.  You have vision changes. Summary  The third trimester is from week 28 through week 40, months 7 through 9. The third trimester is a time when the unborn baby (fetus) is growing rapidly.  During the third trimester, your discomfort may increase as you and your baby continue to gain weight. You may have abdominal, leg, and back pain, sleeping problems, and an increased need to urinate.  During the third trimester your breasts will keep growing and they will continue to become tender. A yellow fluid (colostrum) may leak from your breasts. This is the first milk you are producing for your baby.  False labor is a condition in which you feel small, irregular tightenings of the muscles in the womb (contractions) that eventually go away. These are called Braxton Hicks contractions. Contractions may last for hours, days, or even weeks before true labor sets in.  Signs of labor can include: abdominal cramps; regular contractions that start at 10 minutes apart and become stronger and more frequent with time; watery or bloody mucus discharge that comes from the vagina; increased pelvic pressure and dull back pain; and leaking of amniotic fluid. This information is not intended to replace advice given to you by your health  care provider. Make sure you discuss any questions you have with your health care provider. Document Released: 10/03/2001 Document Revised: 01/30/2019 Document Reviewed: 11/14/2016 Elsevier Patient Education  2020 [employer]  

## 2019-09-17 NOTE — Progress Notes (Signed)
  Subjective  Fetal Movement? yes Contractions? no Leaking Fluid? no Vaginal Bleeding? no  Objective  BP 120/80   Wt 222 lb (100.7 kg)   LMP 02/20/2019   BMI 40.60 kg/m  General: NAD Pumonary: no increased work of breathing Abdomen: gravid, non-tender Extremities: no edema Psychiatric: mood appropriate, affect full  Assessment  33 y.o. [id number] at [redacted]w[redacted]d by  11/27/2019, by Last Menstrual Period presenting for routine prenatal visit  Plan   Problem List Items Addressed This Visit      Other   Encounter for supervision of low-risk pregnancy, antepartum    Other Visit Diagnoses    [redacted] weeks gestation of pregnancy    -  Primary      pregnancy4 Problems (from 02/20/19 to present)    Problem Noted Resolved   Encounter for supervision of low-risk pregnancy, antepartum 01/02/2017 by Gae Dry, MD No   Overview Addendum 09/17/2019  8:31 AM by Gae Dry, MD    [facility] Prenatal Labs  Dating LMP = 8 week [place] Blood type: A/Negative/-- (06/08 1507)   Genetic Screen NIPS: Normal XX Antibody:Negative (06/08 1507)  Anatomic [place] WSOG Rubella: 2.23 (06/08 1507) Varicella: Imm  GTT Third trimester:  RPR: Non Reactive (06/08 1507)   Rhogam 08/2019 HBsAg: Negative (06/08 1507)   TDaP vaccine 11/25 Flu Shot: declined HIV: Non Reactive (06/08 1507)   Baby Food Breast             GBS:   Contraception H/o infertility, counseled all options Pap:03/31/19  CBB  No   CS/VBAC N/A   Support Person Husband             The following were addressed during this visit:  Breastfeeding Education - Early initiation of breastfeeding  - The importance of exclusive breastfeeding  - Risks of giving your baby anything other than breast milk if you are breastfeeding  - Nonpharmacological pain relief methods for labor  - The importance of early skin-to-skin contact  - Rooming-in on a 24-hour basis  - Feeding on demand or baby-led feeding  - Frequent feeding to help assure optimal milk  production  - Effective positioning and attachment  - Exclusive breastfeeding for the first 6 months  - Individualized Education   28-31 weeks - Continuing Work / Travel  - Fetal Growth and Movement  - Tdap Given     Barnett Applebaum, MD, [place], [facility] 09/17/2019  8:38 AM

## 2019-09-17 NOTE — Addendum Note (Signed)
Addended by: Quintella Baton D on: 09/17/2019 08:48 AM   Modules accepted: Orders

## 2019-09-29 ENCOUNTER — Encounter: Payer: Self-pay | Admitting: Obstetrics & Gynecology

## 2019-10-01 ENCOUNTER — Encounter: Payer: Self-pay | Admitting: Obstetrics & Gynecology

## 2019-10-08 ENCOUNTER — Other Ambulatory Visit: Payer: Self-pay

## 2019-10-08 ENCOUNTER — Ambulatory Visit (INDEPENDENT_AMBULATORY_CARE_PROVIDER_SITE_OTHER): Admitting: Obstetrics & Gynecology

## 2019-10-08 VITALS — Wt 222.0 lb

## 2019-10-08 DIAGNOSIS — Z3493 Encounter for supervision of normal pregnancy, unspecified, third trimester: Secondary | ICD-10-CM

## 2019-10-08 DIAGNOSIS — Z3490 Encounter for supervision of normal pregnancy, unspecified, unspecified trimester: Secondary | ICD-10-CM

## 2019-10-08 DIAGNOSIS — Z3A32 32 weeks gestation of pregnancy: Secondary | ICD-10-CM

## 2019-10-08 NOTE — Progress Notes (Signed)
Virtual Visit via Telephone Note  I connected with patient on 10/08/19 at  8:40 AM EST by telephone and verified that I am speaking with the correct person using two identifiers.   I discussed the limitations, risks, security and privacy concerns of performing an evaluation and management service by telephone and the availability of in person appointments. I also discussed with the patient that there may be a patient responsible charge related to this service. The patient expressed understanding and agreed to proceed.  The patient was at home I spoke with the patient from my  office  Penny Jones is a 33 y.o. [phone] at [redacted]w[redacted]d being seen today for ongoing prenatal care.  She is currently monitored for the following issues for this low-risk pregnancy and has Encounter for supervision of low-risk pregnancy, antepartum; Missed abortion; and Infertility associated with anovulation on their problem list.  ----------------------------------------------------------------------------------- Patient reports no complaints.   Denies pain, VB, leaking of fluid.  ----------------------------------------------------------------------------------- The following portions of the patient's history were reviewed and updated as appropriate: allergies, current medications, past family history, past medical history, past social history, past surgical history and problem list. Problem list updated.   Objective  Weight 222 lb (100.7 kg), last menstrual period 02/20/2019, unknown if currently breastfeeding. Pregravid weight 200 lb (90.7 kg) Total Weight Gain 22 lb (9.979 kg)  Physical Exam could not be performed. Because of the COVID-19 outbreak this visit was performed over the phone and not in person.   Assessment   33 y.o. [id number] at [redacted]w[redacted]d by  11/27/2019, by Last Menstrual Period presenting for routine prenatal visit  Plan   pregnancy4 Problems (from 02/20/19 to present)    Problem Noted Resolved   Encounter  for supervision of low-risk pregnancy, antepartum 01/02/2017 by Gae Dry, MD No   Overview Addendum 09/17/2019  8:31 AM by Gae Dry, MD    [facility] Prenatal Labs  Dating LMP = 8 week [place] Blood type: A/Negative/-- (06/08 1507)   Genetic Screen NIPS: Normal XX Antibody:Negative (06/08 1507)  Anatomic [place] [facility] Rubella: 2.23 (06/08 1507) Varicella: Imm  GTT Third trimester:  RPR: Non Reactive (06/08 1507)   Rhogam 08/2019 HBsAg: Negative (06/08 1507)   TDaP vaccine Planned [ ]  Flu Shot: declined HIV: Non Reactive (06/08 1507)   Baby Food Breast             GBS:   Contraception H/o infertility, counseled all options Pap:03/31/19  CBB  No   CS/VBAC N/A   Support Person Husband               Gestational age appropriate obstetric precautions including but not limited to vaginal bleeding, contractions, leaking of fluid and fetal movement were reviewed in detail with the patient.     Follow Up Instructions: 2 weeks   I discussed the assessment and treatment plan with the patient. The patient was provided an opportunity to ask questions and all were answered. The patient agreed with the plan and demonstrated an understanding of the instructions.   The patient was advised to call back or seek an in-person evaluation if the symptoms worsen or if the condition fails to improve as anticipated.  I provided 11 minutes of non-face-to-face time during this encounter.  Return in about 2 weeks (around 10/22/2019) for ROB.  Barnett Applebaum, MD [facility], [facility] 10/08/2019 9:09 AM

## 2019-10-23 ENCOUNTER — Encounter: Payer: Self-pay | Admitting: Obstetrics & Gynecology

## 2019-10-23 ENCOUNTER — Other Ambulatory Visit: Payer: Self-pay

## 2019-10-23 ENCOUNTER — Ambulatory Visit (INDEPENDENT_AMBULATORY_CARE_PROVIDER_SITE_OTHER): Admitting: Obstetrics & Gynecology

## 2019-10-23 VITALS — BP 128/80 | Wt 223.0 lb

## 2019-10-23 DIAGNOSIS — Z3490 Encounter for supervision of normal pregnancy, unspecified, unspecified trimester: Secondary | ICD-10-CM

## 2019-10-23 DIAGNOSIS — Z3483 Encounter for supervision of other normal pregnancy, third trimester: Secondary | ICD-10-CM

## 2019-10-23 DIAGNOSIS — Z3A35 35 weeks gestation of pregnancy: Secondary | ICD-10-CM

## 2019-10-23 LAB — POCT URINALYSIS DIPSTICK OB
Glucose, UA: NEGATIVE
POC,PROTEIN,UA: NEGATIVE

## 2019-10-23 NOTE — Progress Notes (Signed)
  Subjective  Fetal Movement? yes Contractions? no Leaking Fluid? no Vaginal Bleeding? no  Objective  BP 128/80   Wt 223 lb (101.2 kg)   LMP 02/20/2019   BMI 40.79 kg/m  General: NAD Pumonary: no increased work of breathing Abdomen: gravid, non-tender Extremities: no edema Psychiatric: mood appropriate, affect full  Assessment  33 y.o. [id number] at [redacted]w[redacted]d by  11/27/2019, by Last Menstrual Period presenting for routine prenatal visit  Plan   Problem List Items Addressed This Visit      Other   Encounter for supervision of low-risk pregnancy, antepartum    Other Visit Diagnoses    [redacted] weeks gestation of pregnancy    -  Primary      pregnancy4 Problems (from 02/20/19 to present)    Problem Noted Resolved   Encounter for supervision of low-risk pregnancy, antepartum 01/02/2017 by Gae Dry, MD No   Overview Addendum 09/17/2019  8:31 AM by Gae Dry, MD    [facility] Prenatal Labs  Dating LMP = 8 week [place] Blood type: A/Negative/-- (06/08 1507)   Genetic Screen NIPS: Normal XX Antibody:Negative (06/08 1507)  Anatomic [place] [facility] Rubella: 2.23 (06/08 1507) Varicella: Imm  GTT Third trimester:  RPR: Non Reactive (06/08 1507)   Rhogam 08/2019 HBsAg: Negative (06/08 1507)   TDaP vaccine Planned [ ]  Flu Shot: declined HIV: Non Reactive (06/08 1507)   Baby Food Breast             GBS:   Contraception H/o infertility, counseled all options Pap:03/31/19  CBB  No   CS/VBAC N/A   Support Person Husband             GBS nv  PTL precautions discussed  Barnett Applebaum, MD, [facility], [facility] 10/23/2019  10:40 AM

## 2019-10-23 NOTE — Patient Instructions (Signed)
Braxton Hicks Contractions °Contractions of the uterus can occur throughout pregnancy, but they are not always a sign that you are in labor. You may have practice contractions called Braxton Hicks contractions. These false labor contractions are sometimes confused with true labor. °What are Braxton Hicks contractions? °Braxton Hicks contractions are tightening movements that occur in the muscles of the uterus before labor. Unlike true labor contractions, these contractions do not result in opening (dilation) and thinning of the cervix. Toward the end of pregnancy (32-34 weeks), Braxton Hicks contractions can happen more often and may become stronger. These contractions are sometimes difficult to tell apart from true labor because they can be very uncomfortable. You should not feel embarrassed if you go to the hospital with false labor. °Sometimes, the only way to tell if you are in true labor is for your health care provider to look for changes in the cervix. The health care provider will do a physical exam and may monitor your contractions. If you are not in true labor, the exam should show that your cervix is not dilating and your water has not broken. °If there are no other health problems associated with your pregnancy, it is completely safe for you to be sent home with false labor. You may continue to have Braxton Hicks contractions until you go into true labor. °How to tell the difference between true labor and false labor °True labor °· Contractions last 30-70 seconds. °· Contractions become very regular. °· Discomfort is usually felt in the top of the uterus, and it spreads to the lower abdomen and low back. °· Contractions do not go away with walking. °· Contractions usually become more intense and increase in frequency. °· The cervix dilates and gets thinner. °False labor °· Contractions are usually shorter and not as strong as true labor contractions. °· Contractions are usually irregular. °· Contractions  are often felt in the front of the lower abdomen and in the groin. °· Contractions may go away when you walk around or change positions while lying down. °· Contractions get weaker and are shorter-lasting as time goes on. °· The cervix usually does not dilate or become thin. °Follow these instructions at home: ° °· Take over-the-counter and prescription medicines only as told by your health care provider. °· Keep up with your usual exercises and follow other instructions from your health care provider. °· Eat and drink lightly if you think you are going into labor. °· If Braxton Hicks contractions are making you uncomfortable: °? Change your position from lying down or resting to walking, or change from walking to resting. °? Sit and rest in a tub of warm water. °? Drink enough fluid to keep your urine pale yellow. Dehydration may cause these contractions. °? Do slow and deep breathing several times an hour. °· Keep all follow-up prenatal visits as told by your health care provider. This is important. °Contact a health care provider if: °· You have a fever. °· You have continuous pain in your abdomen. °Get help right away if: °· Your contractions become stronger, more regular, and closer together. °· You have fluid leaking or gushing from your vagina. °· You pass blood-tinged mucus (bloody show). °· You have bleeding from your vagina. °· You have low back pain that you never had before. °· You feel your baby’s head pushing down and causing pelvic pressure. °· Your baby is not moving inside you as much as it used to. °Summary °· Contractions that occur before labor are   called Braxton Hicks contractions, false labor, or practice contractions. °· Braxton Hicks contractions are usually shorter, weaker, farther apart, and less regular than true labor contractions. True labor contractions usually become progressively stronger and regular, and they become more frequent. °· Manage discomfort from Braxton Hicks contractions  by changing position, resting in a warm bath, drinking plenty of water, or practicing deep breathing. °This information is not intended to replace advice given to you by your health care provider. Make sure you discuss any questions you have with your health care provider. °Document Revised: 09/21/2017 Document Reviewed: 02/22/2017 °Elsevier Patient Education © 2020 Elsevier Inc. ° °

## 2019-10-23 NOTE — Addendum Note (Signed)
Addended by: Quintella Baton D on: 10/23/2019 11:24 AM   Modules accepted: Orders

## 2019-10-24 NOTE — L&D Delivery Note (Signed)
Date of delivery: 11/09/2019 Estimated Date of Delivery: 11/27/19 Patient's last menstrual period was 02/20/2019. EGA: [redacted]w[redacted]d  Delivery Note At 12:27 AM a viable female was delivered via Vaginal, Spontaneous (Presentation: OA, Right Occiput Anterior).   APGAR: 8, 9; weight: 3570 g, 7 pounds 14 ounces.   Placenta status: Spontaneous, Intact.   Cord: 3 vessels with the following complications: None.  Cord pH: NA  Called to see patient.  Mom pushed to deliver a viable female infant.  The head followed by shoulders, which delivered without difficulty, and the rest of the body.  No nuchal cord noted.  Baby to mom's chest.  Cord clamped and cut after 4 min delay.  Cord blood obtained.  Placenta delivered spontaneously, intact, with a 3-vessel cord.   All counts correct. Hemostasis obtained with IV pitocin and fundal massage.   Anesthesia: Epidural Episiotomy: None Lacerations: None Suture Repair: NA Est. Blood Loss (mL): 465  Mom to postpartum.  Baby to Couplet care / Skin to Skin.  Tresea Mall, CNM 11/09/2019, 1:50 AM

## 2019-10-29 ENCOUNTER — Encounter: Payer: Self-pay | Admitting: Obstetrics & Gynecology

## 2019-10-29 ENCOUNTER — Ambulatory Visit (INDEPENDENT_AMBULATORY_CARE_PROVIDER_SITE_OTHER): Admitting: Obstetrics & Gynecology

## 2019-10-29 ENCOUNTER — Other Ambulatory Visit: Payer: Self-pay

## 2019-10-29 VITALS — BP 122/82 | Wt 222.0 lb

## 2019-10-29 DIAGNOSIS — Z3A35 35 weeks gestation of pregnancy: Secondary | ICD-10-CM

## 2019-10-29 DIAGNOSIS — Z3490 Encounter for supervision of normal pregnancy, unspecified, unspecified trimester: Secondary | ICD-10-CM

## 2019-10-29 DIAGNOSIS — Z3493 Encounter for supervision of normal pregnancy, unspecified, third trimester: Secondary | ICD-10-CM

## 2019-10-29 DIAGNOSIS — Z3685 Encounter for antenatal screening for Streptococcus B: Secondary | ICD-10-CM

## 2019-10-29 LAB — FETAL NONSTRESS TEST

## 2019-10-29 LAB — OB RESULTS CONSOLE GBS: GBS: NEGATIVE

## 2019-10-29 NOTE — Progress Notes (Signed)
  Subjective  Fetal Movement? yes Contractions? no Leaking Fluid? no Vaginal Bleeding? no  Objective  BP 122/82   Wt 222 lb (100.7 kg)   LMP 02/20/2019   BMI 40.60 kg/m  General: NAD Pumonary: no increased work of breathing Abdomen: gravid, non-tender Extremities: no edema Psychiatric: mood appropriate, affect full  Assessment  34 y.o. [id number] at [redacted]w[redacted]d by  11/27/2019, by Last Menstrual Period presenting for routine prenatal visit  Plan   Problem List Items Addressed This Visit      Other   Encounter for supervision of low-risk pregnancy, antepartum - Primary    Other Visit Diagnoses    [redacted] weeks gestation of pregnancy       Antenatal screening for streptococcus B       Relevant Orders   GBS RV Culture      pregnancy4 Problems (from 02/20/19 to present)    Problem Noted Resolved   Encounter for supervision of low-risk pregnancy, antepartum 01/02/2017 by Nadara Mustard, MD No   Overview Addendum 09/17/2019  8:31 AM by Nadara Mustard, MD    [facility] Prenatal Labs  Dating LMP = 8 week [place] Blood type: A/Negative/-- (06/08 1507)   Genetic Screen NIPS: Normal XX Antibody:Negative (06/08 1507)  Anatomic [place] [facility] Rubella: 2.23 (06/08 1507) Varicella: Imm  GTT Third trimester:  RPR: Non Reactive (06/08 1507)   Rhogam 08/2019 HBsAg: Negative (06/08 1507)   TDaP vaccine Planned [ ]  Flu Shot: declined HIV: Non Reactive (06/08 1507)   Baby Food Breast             GBS:   Contraception H/o infertility, counseled all options Pap:03/31/19  CBB  No   CS/VBAC N/A   Support Person Husband             GBS today  05/31/19, MD, [facility], [facility] 10/29/2019  10:50 AM

## 2019-10-29 NOTE — Patient Instructions (Signed)
Group B Streptococcus Infection During Pregnancy °Group B Streptococcus (GBS) is a type of bacteria that is often found in healthy people. It is commonly found in the rectum, vagina, and intestines. In people who are healthy and not pregnant, the bacteria rarely cause serious illness or complications. However, women who test positive for GBS during pregnancy can pass the bacteria to the baby during childbirth. This can cause serious infection in the baby after birth. °Women with GBS may also have infections during their pregnancy or soon after childbirth. The infections include urinary tract infections (UTIs) or infections of the uterus. GBS also increases a woman's risk of complications during pregnancy, such as early labor or delivery, miscarriage, or stillbirth. Routine testing for GBS is recommended for all pregnant women. °What are the causes? °This condition is caused by bacteria called Streptococcus agalactiae. °What increases the risk? °You may have a higher risk for GBS infection during pregnancy if you had one during a past pregnancy. °What are the signs or symptoms? °In most cases, GBS infection does not cause symptoms in pregnant women. If symptoms exist, they may include: °· Labor that starts before the 37th week of pregnancy. °· A UTI or bladder infection. This may cause a fever, frequent urination, or pain and burning during urination. °· Fever during labor. There can also be a rapid heartbeat in the mother or baby. °Rare but serious symptoms of a GBS infection in women include: °· Blood infection (septicemia). This may cause fever, chills, or confusion. °· Lung infection (pneumonia). This may cause fever, chills, cough, rapid breathing, chest pain, or difficulty breathing. °· Bone, joint, skin, or soft tissue infection. °How is this diagnosed? °You may be screened for GBS between week 35 and week 37 of pregnancy. If you have symptoms of preterm labor, you may be screened earlier. This condition is  diagnosed based on lab test results from: °· A swab of fluid from the vagina and rectum. °· A urine sample. °How is this treated? °This condition is treated with antibiotic medicine. Antibiotic medicine may be given: °· To you when you go into labor, or as soon as your water breaks. The medicines will continue until after you give birth. If you are having a cesarean delivery, you do not need antibiotics unless your water has broken. °· To your baby, if he or she requires treatment. Your health care provider will check your baby to decide if he or she needs antibiotics to prevent a serious infection. °Follow these instructions at home: °· Take over-the-counter and prescription medicines only as told by your health care provider. °· Take your antibiotic medicine as told by your health care provider. Do not stop taking the antibiotic even if you start to feel better. °· Keep all pre-birth (prenatal) visits and follow-up visits as told by your health care provider. This is important. °Contact a health care provider if: °· You have pain or burning when you urinate. °· You have to urinate more often than usual. °· You have a fever or chills. °· You develop a bad-smelling vaginal discharge. °Get help right away if: °· Your water breaks. °· You go into labor. °· You have severe pain in your abdomen. °· You have difficulty breathing. °· You have chest pain. °These symptoms may represent a serious problem that is an emergency. Do not wait to see if the symptoms will go away. Get medical help right away. Call your local emergency services (911 in the [place] Do not drive yourself to   the hospital. °Summary °· GBS is a type of bacteria that is common in healthy people. °· During pregnancy, colonization with GBS can cause serious complications for you or your baby. °· Your health care provider will screen you between 35 and 37 weeks of pregnancy to determine if you are colonized with GBS. °· If you are colonized with GBS during  pregnancy, your health care provider will recommend antibiotics through an IV during labor. °· After delivery, your baby will be evaluated for complications related to potential GBS infection and may require antibiotics to prevent a serious infection. °This information is not intended to replace advice given to you by your health care provider. Make sure you discuss any questions you have with your health care provider. °Document Revised: 05/05/2019 Document Reviewed: 05/05/2019 °Elsevier Patient Education © 2020 Elsevier Inc. ° °

## 2019-11-02 LAB — CULTURE, BETA STREP (GROUP B ONLY): Strep Gp B Culture: NEGATIVE

## 2019-11-04 NOTE — Telephone Encounter (Signed)
Please advise 

## 2019-11-07 ENCOUNTER — Encounter: Payer: Self-pay | Admitting: Obstetrics & Gynecology

## 2019-11-07 ENCOUNTER — Inpatient Hospital Stay
Admission: EM | Admit: 2019-11-07 | Discharge: 2019-11-10 | DRG: 806 | Disposition: A | Discharge status: Home / Self Care | Attending: Obstetrics & Gynecology | Admitting: Obstetrics & Gynecology

## 2019-11-07 ENCOUNTER — Observation Stay
Admission: RE | Admit: 2019-11-07 | Discharge: 2019-11-07 | Disposition: A | Discharge status: Home / Self Care | Source: Home / Self Care | Attending: Obstetrics & Gynecology | Admitting: Obstetrics & Gynecology

## 2019-11-07 ENCOUNTER — Other Ambulatory Visit: Payer: Self-pay | Admitting: Obstetrics & Gynecology

## 2019-11-07 ENCOUNTER — Other Ambulatory Visit: Payer: Self-pay

## 2019-11-07 ENCOUNTER — Ambulatory Visit (INDEPENDENT_AMBULATORY_CARE_PROVIDER_SITE_OTHER): Admitting: Obstetrics & Gynecology

## 2019-11-07 VITALS — BP 120/88 | Wt 226.0 lb

## 2019-11-07 DIAGNOSIS — O368330 Maternal care for abnormalities of the fetal heart rate or rhythm, third trimester, not applicable or unspecified: Secondary | ICD-10-CM | POA: Diagnosis present

## 2019-11-07 DIAGNOSIS — O133 Gestational [pregnancy-induced] hypertension without significant proteinuria, third trimester: Secondary | ICD-10-CM

## 2019-11-07 DIAGNOSIS — O99213 Obesity complicating pregnancy, third trimester: Secondary | ICD-10-CM | POA: Diagnosis not present

## 2019-11-07 DIAGNOSIS — Z87891 Personal history of nicotine dependence: Secondary | ICD-10-CM

## 2019-11-07 DIAGNOSIS — O163 Unspecified maternal hypertension, third trimester: Secondary | ICD-10-CM | POA: Insufficient documentation

## 2019-11-07 DIAGNOSIS — O9081 Anemia of the puerperium: Secondary | ICD-10-CM | POA: Diagnosis not present

## 2019-11-07 DIAGNOSIS — O134 Gestational [pregnancy-induced] hypertension without significant proteinuria, complicating childbirth: Secondary | ICD-10-CM | POA: Diagnosis present

## 2019-11-07 DIAGNOSIS — O164 Unspecified maternal hypertension, complicating childbirth: Secondary | ICD-10-CM | POA: Diagnosis present

## 2019-11-07 DIAGNOSIS — D62 Acute posthemorrhagic anemia: Secondary | ICD-10-CM | POA: Diagnosis not present

## 2019-11-07 DIAGNOSIS — Z20822 Contact with and (suspected) exposure to covid-19: Secondary | ICD-10-CM | POA: Diagnosis present

## 2019-11-07 DIAGNOSIS — O368390 Maternal care for abnormalities of the fetal heart rate or rhythm, unspecified trimester, not applicable or unspecified: Secondary | ICD-10-CM

## 2019-11-07 DIAGNOSIS — O99214 Obesity complicating childbirth: Secondary | ICD-10-CM | POA: Diagnosis present

## 2019-11-07 DIAGNOSIS — Z3490 Encounter for supervision of normal pregnancy, unspecified, unspecified trimester: Secondary | ICD-10-CM

## 2019-11-07 DIAGNOSIS — Z3A37 37 weeks gestation of pregnancy: Secondary | ICD-10-CM

## 2019-11-07 DIAGNOSIS — O139 Gestational [pregnancy-induced] hypertension without significant proteinuria, unspecified trimester: Secondary | ICD-10-CM | POA: Diagnosis present

## 2019-11-07 LAB — CBC
HCT: 34.3 % — ABNORMAL LOW (ref 36.0–46.0)
HCT: 34.6 % — ABNORMAL LOW (ref 36.0–46.0)
Hemoglobin: 11.2 g/dL — ABNORMAL LOW (ref 12.0–15.0)
Hemoglobin: 11.5 g/dL — ABNORMAL LOW (ref 12.0–15.0)
MCH: 28.6 pg (ref 26.0–34.0)
MCH: 28.3 pg (ref 26.0–34.0)
MCHC: 32.7 g/dL (ref 30.0–36.0)
MCHC: 33.2 g/dL (ref 30.0–36.0)
MCV: 87.5 fL (ref 80.0–100.0)
MCV: 85 fL (ref 80.0–100.0)
Platelets: 176 10*3/uL (ref 150–400)
Platelets: 182 10*3/uL (ref 150–400)
RBC: 3.92 MIL/uL (ref 3.87–5.11)
RBC: 4.07 MIL/uL (ref 3.87–5.11)
RDW: 13 % (ref 11.5–15.5)
RDW: 12.9 % (ref 11.5–15.5)
WBC: 11 10*3/uL — ABNORMAL HIGH (ref 4.0–10.5)
WBC: 12.2 10*3/uL — ABNORMAL HIGH (ref 4.0–10.5)
nRBC: 0 % (ref 0.0–0.2)
nRBC: 0 % (ref 0.0–0.2)

## 2019-11-07 LAB — PROTEIN / CREATININE RATIO, URINE
Creatinine, Urine: 39 mg/dL
Total Protein, Urine: <6 mg/dL

## 2019-11-07 LAB — RESPIRATORY PANEL BY RT PCR (FLU A&B, COVID)
Influenza A by PCR: NEGATIVE
Influenza B by PCR: NEGATIVE
SARS Coronavirus 2 by RT PCR: NEGATIVE

## 2019-11-07 LAB — COMPREHENSIVE METABOLIC PANEL
ALT: 11 U/L (ref 0–44)
AST: 13 U/L — ABNORMAL LOW (ref 15–41)
Albumin: 2.7 g/dL — ABNORMAL LOW (ref 3.5–5.0)
Alkaline Phosphatase: 141 U/L — ABNORMAL HIGH (ref 38–126)
Anion gap: 9 (ref 5–15)
BUN: 11 mg/dL (ref 6–20)
CO2: 19 mmol/L — ABNORMAL LOW (ref 22–32)
Calcium: 8.6 mg/dL — ABNORMAL LOW (ref 8.9–10.3)
Chloride: 105 mmol/L (ref 98–111)
Creatinine, Ser: 0.61 mg/dL (ref 0.44–1.00)
GFR calc Af Amer: >60 mL/min (ref >60)
GFR calc non Af Amer: >60 mL/min (ref >60)
Glucose, Bld: 90 mg/dL (ref 70–99)
Potassium: 3.8 mmol/L (ref 3.5–5.1)
Sodium: 133 mmol/L — ABNORMAL LOW (ref 135–145)
Total Bilirubin: 0.5 mg/dL (ref 0.3–1.2)
Total Protein: 6.2 g/dL — ABNORMAL LOW (ref 6.5–8.1)

## 2019-11-07 MED ORDER — LIDOCAINE HCL (PF) 1 % IJ SOLN
30.0000 mL | INTRAMUSCULAR | Status: AC | PRN
  Administered 2019-11-08 (×2): 1 mL via SUBCUTANEOUS

## 2019-11-07 MED ORDER — ONDANSETRON HCL 4 MG/2ML IJ SOLN
4.0000 mg | Freq: Four times a day (QID) | INTRAMUSCULAR | Status: DC | PRN
  Administered 2019-11-08: 4 mg via INTRAVENOUS
  Filled 2019-11-07: qty 2

## 2019-11-07 MED ORDER — AMMONIA AROMATIC IN INHA
RESPIRATORY_TRACT | Status: AC
  Filled 2019-11-07: qty 10

## 2019-11-07 MED ORDER — ACETAMINOPHEN 325 MG PO TABS
650.0000 mg | ORAL_TABLET | ORAL | Status: DC | PRN
  Administered 2019-11-09: 650 mg via ORAL
  Filled 2019-11-07: qty 2

## 2019-11-07 MED ORDER — OXYTOCIN 40 UNITS IN NORMAL SALINE INFUSION - SIMPLE MED
2.5000 [IU]/h | INTRAVENOUS | Status: DC
  Filled 2019-11-07: qty 1000

## 2019-11-07 MED ORDER — MISOPROSTOL 200 MCG PO TABS
ORAL_TABLET | ORAL | Status: AC
  Filled 2019-11-07: qty 4

## 2019-11-07 MED ORDER — BUTORPHANOL TARTRATE 1 MG/ML IJ SOLN: 1.0000 mg | INTRAMUSCULAR | Status: DC | PRN

## 2019-11-07 MED ORDER — LACTATED RINGERS IV SOLN: INTRAVENOUS | Status: DC

## 2019-11-07 MED ORDER — OXYTOCIN BOLUS FROM INFUSION
500.0000 mL | Freq: Once | INTRAVENOUS | Status: AC
  Administered 2019-11-09: 500 mL via INTRAVENOUS

## 2019-11-07 MED ORDER — MISOPROSTOL 25 MCG QUARTER TABLET
25.0000 ug | ORAL_TABLET | ORAL | Status: DC | PRN
  Administered 2019-11-07 – 2019-11-08 (×2): 25 ug via VAGINAL
  Filled 2019-11-07 (×2): qty 1

## 2019-11-07 MED ORDER — OXYTOCIN 10 UNIT/ML IJ SOLN
INTRAMUSCULAR | Status: AC
  Filled 2019-11-07: qty 2

## 2019-11-07 MED ORDER — LIDOCAINE HCL (PF) 1 % IJ SOLN
INTRAMUSCULAR | Status: AC
  Filled 2019-11-07: qty 30

## 2019-11-07 MED ORDER — CALCIUM CARBONATE ANTACID 500 MG PO CHEW
CHEWABLE_TABLET | ORAL | Status: AC
  Filled 2019-11-07: qty 2

## 2019-11-07 MED ORDER — TERBUTALINE SULFATE 1 MG/ML IJ SOLN: 0.2500 mg | Freq: Once | INTRAMUSCULAR | Status: DC | PRN

## 2019-11-07 MED ORDER — LACTATED RINGERS IV SOLN
500.0000 mL | INTRAVENOUS | Status: DC | PRN
  Administered 2019-11-08: 500 mL via INTRAVENOUS

## 2019-11-07 MED ORDER — CALCIUM CARBONATE ANTACID 500 MG PO CHEW
400.0000 mg | CHEWABLE_TABLET | Freq: Once | ORAL | Status: AC | PRN
  Administered 2019-11-07: 12:00:00 400 mg via ORAL

## 2019-11-07 NOTE — H&P (Signed)
OB History & Physical   History of Present Illness:  Chief Complaint:   HPI:  Penny Jones is a 34 y.o. G33P1021 female with EDC=11/27/2019 at [redacted]w[redacted]d dated by LMP=8wk5d [place].  Her pregnancy has been complicated by obesity (current BMI=41.34kg/m2) and concerns for a low lying placenta that resolved. She is RH negative and did receive Rhogam on 09/03/19.   She presents to L&D for evaluation of a fetal arrhythmia ( irregular irregularity in rhythm) that was heard in the office at a routine visit. Baby has been moving well. Denies regular contractions, bleeding or leakage of fluid. Denies persistent headaches, visual changes, SOB, CP, nausea/ vomiting or epigastric pain.    Prenatal care site: Prenatal care at [facility] has also been remarkable for a 20# weight gain and the following:    [facility] Prenatal Labs  Dating LMP = 8 week [place] Blood type: A/Negative/-- (06/08 1507)   Genetic Screen NIPS: Normal XX Antibody:Negative (11/11 1032)  Anatomic [place] [facility] Rubella: 2.23 (06/08 1507) Varicella: Imm  GTT Third trimester:  RPR: Non Reactive (11/11 1032)   Rhogam 08/2019 HBsAg: Negative (06/08 1507)   TDaP vaccine 08/2019 Flu Shot: declined HIV: Non Reactive (11/11 1032)   Baby Food Breast             IOE:VOJJKKXF/-- (01/06 1242)  Contraception H/o infertility, counseled all options Pap:03/31/19  CBB  No Pelvis proven to 7#2oz  CS/VBAC N/A   Support Person Husband       OB History  Gravida Para Term Preterm AB Living  4 1 1   2 1   SAB TAB Ectopic Multiple Live Births  2       1    # Outcome Date GA Lbr Len/2nd Weight Sex Delivery Anes PTL Lv  4 Current           3 Term 06/08/12 [redacted]w[redacted]d  3239 g F Vag-Spont   LIV  2 SAB           1 SAB                 Maternal Medical History:   Past Medical History:  Diagnosis Date  . GERD (gastroesophageal reflux disease)    OCC  . Heart murmur    ASYMPTOMATIC    Past Surgical History:  Procedure Laterality Date  . DILATION AND  EVACUATION N/A 03/01/2017   Procedure: DILATATION AND EVACUATION;  Surgeon: 05/01/2017, MD;  Location: [facility] ORS;  Service: Gynecology;  Laterality: N/A;    Allergies  Allergen Reactions  . Pollen Extract     Prior to Admission medications   Medication Sig Start Date End Date Taking? Authorizing Provider  acetaminophen (TYLENOL) 500 MG tablet Take 500-1,000 mg by mouth every 6 (six) hours as needed (for pain/headaches.).   Yes [provider]  calcium carbonate (TUMS - DOSED IN MG ELEMENTAL CALCIUM) 500 MG chewable tablet Chew 1 tablet by mouth as needed for indigestion or heartburn.   Yes [provider]  Prenat-Fe Poly-Methfol-FA-DHA (VITAFOL ULTRA) 29-0.6-0.4-200 MG CAPS Take 1 capsule by mouth daily. 03/31/19  Yes 05/31/19, MD          Social History: She  reports that she has quit smoking. She has never used smokeless tobacco. She reports current alcohol use. She reports that she does not use drugs.  Family History: family history includes Hypertension in her mother.   Review of Systems: Negative x 10 systems reviewed except as noted  in the HPI.      Physical Exam:  Vital Signs: BP (!) 146/94   Pulse 79   Temp 98.1 F (36.7 C) (Oral)   Resp 16   Ht 5\' 2"  (1.575 m)   Wt 102.5 kg   LMP 02/20/2019   BMI 41.34 kg/m    Patient Vitals for the past 24 hrs:  BP Temp Temp src Pulse Resp Height Weight  11/07/19 1409 (!) 146/94 -- -- 79 -- -- --  11/07/19 1354 (!) 152/100 -- -- 79 -- -- --  11/07/19 1339 (!) 149/95 -- -- 76 -- -- --  11/07/19 1324 (!) 149/91 -- -- 73 -- -- --  11/07/19 1309 (!) 142/91 -- -- 77 -- -- --  11/07/19 1254 (!) 143/91 -- -- 82 -- -- --  11/07/19 1224 (!) 152/94 -- -- 87 -- -- --  11/07/19 1209 (!) 144/98 -- -- 91 -- -- --  11/07/19 1154 138/90 -- -- (!) 114 -- -- --  11/07/19 1131 (!) 142/97 98.1 F (36.7 C) Oral 97 16 5\' 2"  (1.575 m) 102.5 kg  11/07/19 1130 (!) 145/108 -- -- 100 -- -- --   General: gravid WF,  appears anxious  HEENT: normocephalic, atraumatic Heart: regular rate & rhythm.  No murmurs/rubs/gallops Lungs: clear to auscultation bilaterally Abdomen: soft, gravid, non-tender;  EFW: 7#12oz Pelvic:   External: Normal external female genitalia  Cervix: 1.5/30%/-2/ cephalic (LOT on ultrasound) Extremities: non-tender, symmetric, no edema bilaterally.  DTRs: +1  Neurologic: Alert & oriented x 3.   Baseline FHR: 135 with accelerations to 150s to 160 with moderate variability. Irregular irregularity heard fairly frequently Toco: mild, irregular contractions   Results for orders placed or performed during the hospital encounter of 11/07/19 (from the past 24 hour(s))  Comprehensive metabolic panel     Status: Abnormal   Collection Time: 11/07/19 12:40 PM  Result Value Ref Range   Sodium 133 (L) 135 - 145 mmol/L   Potassium 3.8 3.5 - 5.1 mmol/L   Chloride 105 98 - 111 mmol/L   CO2 19 (L) 22 - 32 mmol/L   Glucose, Bld 90 70 - 99 mg/dL   BUN 11 6 - 20 mg/dL   Creatinine, Ser 11/09/19 0.44 - 1.00 mg/dL   Calcium 8.6 (L) 8.9 - 10.3 mg/dL   Total Protein 6.2 (L) 6.5 - 8.1 g/dL   Albumin 2.7 (L) 3.5 - 5.0 g/dL   AST 13 (L) 15 - 41 U/L   ALT 11 0 - 44 U/L   Alkaline Phosphatase 141 (H) 38 - 126 U/L   Total Bilirubin 0.5 0.3 - 1.2 mg/dL   GFR calc non Af Amer >60 >60 mL/min   GFR calc Af Amer >60 >60 mL/min   Anion gap 9 5 - 15  CBC     Status: Abnormal   Collection Time: 11/07/19 12:40 PM  Result Value Ref Range   WBC 11.0 (H) 4.0 - 10.5 K/uL   RBC 3.92 3.87 - 5.11 MIL/uL   Hemoglobin 11.2 (L) 12.0 - 15.0 g/dL   HCT 6.94 (L) 11/09/19 - 85.4 %   MCV 87.5 80.0 - 100.0 fL   MCH 28.6 26.0 - 34.0 pg   MCHC 32.7 30.0 - 36.0 g/dL   RDW 62.7 03.5 - 00.9 %   Platelets 176 150 - 400 K/uL   nRBC 0.0 0.0 - 0.2 %  Protein / creatinine ratio, urine     Status: None   Collection Time: 11/07/19  1:02  PM  Result Value Ref Range   Creatinine, Urine 39 mg/dL   Total Protein, Urine <6 mg/dL   Protein  Creatinine Ratio        0.00 - 0.15 mg/mg[Cre]   Assessment:  Penny Jones is a 34 y.o. G9P1021 female at [redacted]w[redacted]d with fetal arrhythmia (probably PACs) in the setting of an otherwise reactive NST. Persistently elevated blood pressures-no evidence of preeclampsia  Probable gestational hypertension.   Plan: Consulted Dr Kenton Kingfisher regarding plan of management Will discharge home to get her bags, eat and arrange for childcare RTN this evening for IOL Discussed methods of induction and will most likely start the induction with Cytotec.   Dalia Heading, CNM

## 2019-11-07 NOTE — Patient Instructions (Signed)
Irregular rhythms due to intermittently blocked beats tend to be well tolerated and rarely progress to serious arrhythmias. Premature atrial complex (PAC; also referred to as premature atrial beat, premature supraventricular complex, or premature supraventricular beat) are the most common cause of an irregular rhythm and are generally benign.

## 2019-11-07 NOTE — Progress Notes (Signed)
  Subjective  Fetal Movement? yes Contractions? no Leaking Fluid? no Vaginal Bleeding? no  Objective  BP 120/88   Wt 226 lb (102.5 kg)   LMP 02/20/2019   BMI 41.34 kg/m  General: NAD Pumonary: no increased work of breathing Abdomen: gravid, non-tender Extremities: no edema Psychiatric: mood appropriate, affect full  Assessment  34 y.o. [id number] at [redacted]w[redacted]d by  11/27/2019, by Last Menstrual Period presenting for routine prenatal visit  Plan   Problem List Items Addressed This Visit      Other   Encounter for supervision of low-risk pregnancy, antepartum    Other Visit Diagnoses    [redacted] weeks gestation of pregnancy    -  Primary      pregnancy4 Problems (from 02/20/19 to present)    Problem Noted Resolved   Encounter for supervision of low-risk pregnancy, antepartum 01/02/2017 by Nadara Mustard, MD No   Overview Addendum 11/07/2019 10:21 AM by Nadara Mustard, MD    [facility] Prenatal Labs  Dating LMP = 8 week [place] Blood type: A/Negative/-- (06/08 1507)   Genetic Screen NIPS: Normal XX Antibody:Negative (11/11 1032)  Anatomic [place] [facility] Rubella: 2.23 (06/08 1507) Varicella: Imm  GTT Third trimester:  RPR: Non Reactive (11/11 1032)   Rhogam 08/2019 HBsAg: Negative (06/08 1507)   TDaP vaccine 08/2019 Flu Shot: declined HIV: Non Reactive (11/11 1032)   Baby Food Breast             DJS:HFWYOVZC/-- (01/06 1242)  Contraception H/o infertility, counseled all options Pap:03/31/19  CBB  No   CS/VBAC N/A   Support Person Husband             Labor precautions discussed  Several PACs or fetal arrhytmia noted.  Will monitor longer on L&D. NST done today here as well.  A NST procedure was performed with FHR monitoring and a normal baseline established, appropriate time of 20-40 minutes of evaluation, and accels >2 seen w 15x15 characteristics.  Audible PACs noted.  Results show a REACTIVE NST.   Annamarie Major, MD, [facility], [facility] 11/07/2019   10:33 AM

## 2019-11-07 NOTE — Progress Notes (Signed)
Patient seen over from office for NST/fetal monitoring. Patient denies contractions, vaginal bleeding or leaking of fluid. VS obtained and BP elevated at 145/108. Pt was crying and stated she was very anxious. BP repeated and cycled to take every 15 minutes. Patient denies headache, blurry vision and seeing spots, and RUQ pain. Patient has +1 reflexes and absent clonus on assessment. Midwife notified of findings and RN assessment. Will continue to monitor.

## 2019-11-07 NOTE — Final Progress Note (Addendum)
Physician Final Progress Note  Patient ID: Penny Jones MRN: [id number] DOB/AGE: 1986/07/05 34 y.o.  Admit date: 11/07/2019 Admitting provider: Nadara Mustard, MD/ Gasper Lloyd. Sharen Hones, CNM Discharge date: 11/07/2019   Admission Diagnoses: Fetal arrhythmia affecting management of mother  Discharge Diagnoses:  Active Problems:   Fetal heart rate/rhythm abnormality affecting management of mother   Elevated blood pressure affecting pregnancy in third trimester, antepartum  Probable gestational hypertension  Consults: None  Significant Findings/ Diagnostic Studies: 34 year old G79 P70 with EDC=11/27/2019 presented from office for prolonged fetal monitoring due to an irregular fetal heart rhythm. The fetal heart tracing was reactive but the arrhythmia was heard frequently. All of Penny Jones's blood pressures were elevated in the mild range :  Patient Vitals for the past 24 hrs:  BP Temp Temp src Pulse Resp Height Weight  11/07/19 1409 (!) 146/94 -- -- 79 -- -- --  11/07/19 1354 (!) 152/100 -- -- 79 -- -- --  11/07/19 1339 (!) 149/95 -- -- 76 -- -- --  11/07/19 1324 (!) 149/91 -- -- 73 -- -- --  11/07/19 1309 (!) 142/91 -- -- 77 -- -- --  11/07/19 1254 (!) 143/91 -- -- 82 -- -- --  11/07/19 1224 (!) 152/94 -- -- 87 -- -- --  11/07/19 1209 (!) 144/98 -- -- 91 -- -- --  11/07/19 1154 138/90 -- -- (!) 114 -- -- --  11/07/19 1131 (!) 142/97 98.1 F (36.7 C) Oral 97 16 5\' 2"  (1.575 m) 102.5 kg  11/07/19 1130 (!) 145/108 -- -- 100 -- -- --   She was asymptomatic of preeclampsia and her preeclampsia labs were all normal.  Consulted Dr 11/09/19 regarding plan of management and he recommended admitting patient for induction of labor for probable gestational hypertension and fetal arrhythmia Advised patient she can go home and get her bags, eat and arrange for child care and return after supper for admission for induction. See H&P.   Procedures: none  Discharge Condition: stable  Disposition:  Discharge disposition: 01-Home or Self Care       Diet: Regular diet     Allergies as of 11/07/2019      Reactions   Pollen Extract       Medication List    TAKE these medications   acetaminophen 500 MG tablet Commonly known as: TYLENOL Take 500-1,000 mg by mouth every 6 (six) hours as needed (for pain/headaches.).   calcium carbonate 500 MG chewable tablet Commonly known as: TUMS - dosed in mg elemental calcium Chew 1 tablet by mouth as needed for indigestion or heartburn.   Vitafol Ultra 29-0.6-0.4-200 MG Caps Take 1 capsule by mouth daily.        Total time spent taking care of this patient: 30 minutes  Signed: 01[birth year]1987 11/07/2019, 3:04 PM   Attestation of Attending Supervision of Advanced Practitioner (PA/CNM/NP): Evaluation and management procedures were performed by the Advanced Practitioner under my supervision and collaboration.  I have reviewed the Advanced Practitioner's note and chart, and I agree with the management and plan.  11/09/2019, MD, Annamarie Major Ob/Gyn, [facility] 11/11/2019  8:02 AM

## 2019-11-07 NOTE — Progress Notes (Signed)
RN reviewed discharge instructions with patient. RN told patient to return to hospital at 2000 for induction and to call Labor and Delivery with any other questions prior to arrival. Patient verbalized understanding and all questions answered.

## 2019-11-08 ENCOUNTER — Encounter: Payer: Self-pay | Admitting: Obstetrics & Gynecology

## 2019-11-08 ENCOUNTER — Inpatient Hospital Stay: Admitting: Anesthesiology

## 2019-11-08 DIAGNOSIS — O368390 Maternal care for abnormalities of the fetal heart rate or rhythm, unspecified trimester, not applicable or unspecified: Secondary | ICD-10-CM

## 2019-11-08 DIAGNOSIS — Z3A37 37 weeks gestation of pregnancy: Secondary | ICD-10-CM

## 2019-11-08 LAB — RPR: RPR Ser Ql: NONREACTIVE

## 2019-11-08 MED ORDER — LACTATED RINGERS IV SOLN
500.0000 mL | Freq: Once | INTRAVENOUS | Status: AC
  Administered 2019-11-08: 500 mL via INTRAVENOUS

## 2019-11-08 MED ORDER — SODIUM CHLORIDE (PF) 0.9 % IJ SOLN
INTRAMUSCULAR | Status: DC | PRN
  Administered 2019-11-08: 12 mL/h via EPIDURAL

## 2019-11-08 MED ORDER — DIPHENHYDRAMINE HCL 50 MG/ML IJ SOLN: 12.5000 mg | INTRAMUSCULAR | Status: DC | PRN

## 2019-11-08 MED ORDER — CALCIUM CARBONATE ANTACID 500 MG PO CHEW
400.0000 mg | CHEWABLE_TABLET | Freq: Two times a day (BID) | ORAL | Status: DC
  Administered 2019-11-08 (×2): 200 mg via ORAL
  Filled 2019-11-08 (×2): qty 2

## 2019-11-08 MED ORDER — LIDOCAINE-EPINEPHRINE (PF) 1.5 %-1:200000 IJ SOLN
INTRAMUSCULAR | Status: DC | PRN
  Administered 2019-11-08: 3 mL via EPIDURAL

## 2019-11-08 MED ORDER — EPHEDRINE 5 MG/ML INJ
10.0000 mg | INTRAVENOUS | Status: DC | PRN
  Filled 2019-11-08: qty 2

## 2019-11-08 MED ORDER — CALCIUM CARBONATE ANTACID 500 MG PO CHEW
CHEWABLE_TABLET | ORAL | Status: AC
  Administered 2019-11-08: 200 mg via ORAL
  Filled 2019-11-08: qty 1

## 2019-11-08 MED ORDER — PHENYLEPHRINE 40 MCG/ML (10ML) SYRINGE FOR IV PUSH (FOR BLOOD PRESSURE SUPPORT)
80.0000 ug | PREFILLED_SYRINGE | INTRAVENOUS | Status: DC | PRN
  Filled 2019-11-08: qty 10

## 2019-11-08 MED ORDER — TERBUTALINE SULFATE 1 MG/ML IJ SOLN: 0.2500 mg | Freq: Once | INTRAMUSCULAR | Status: DC | PRN

## 2019-11-08 MED ORDER — OXYTOCIN 40 UNITS IN NORMAL SALINE INFUSION - SIMPLE MED
1.0000 m[IU]/min | INTRAVENOUS | Status: DC
  Administered 2019-11-08: 1 m[IU]/min via INTRAVENOUS

## 2019-11-08 MED ORDER — FENTANYL 2.5 MCG/ML W/ROPIVACAINE 0.15% IN NS 100 ML EPIDURAL (ARMC)
EPIDURAL | Status: AC
  Administered 2019-11-08: 250 ug
  Filled 2019-11-08: qty 100

## 2019-11-08 MED ORDER — SODIUM CHLORIDE 0.9 % IV SOLN
INTRAVENOUS | Status: DC | PRN
  Administered 2019-11-08 (×2): 5 mL via EPIDURAL

## 2019-11-08 MED ORDER — FENTANYL 2.5 MCG/ML W/ROPIVACAINE 0.15% IN NS 100 ML EPIDURAL (ARMC)
12.0000 mL/h | EPIDURAL | Status: DC
  Administered 2019-11-08: 12 mL/h via EPIDURAL
  Filled 2019-11-08 (×2): qty 100

## 2019-11-08 NOTE — Anesthesia Procedure Notes (Signed)
Epidural Patient location during procedure: OB Start time: 11/08/2019 5:20 PM End time: 11/08/2019 5:26 PM  Staffing Anesthesiologist: Jaquelyn Sakamoto, Cleda Mccreedy, MD Performed: anesthesiologist   Preanesthetic Checklist Completed: patient identified, IV checked, site marked, risks and benefits discussed, surgical consent, monitors and equipment checked, pre-op evaluation and timeout performed  Epidural Patient position: sitting Prep: ChloraPrep Patient monitoring: heart rate, continuous pulse ox and blood pressure Approach: midline Location: L3-L4 Injection technique: LOR saline  Needle:  Needle type: Tuohy  Needle gauge: 17 G Needle length: 9 cm and 9 Needle insertion depth: 7 cm Catheter type: closed end flexible Catheter size: 19 Gauge Catheter at skin depth: 12 cm Test dose: negative and 1.5% lidocaine with Epi 1:200 K  Assessment Sensory level: T10 Events: blood not aspirated, injection not painful, no injection resistance, no paresthesia and negative IV test  Additional Notes 2 attempts Pt. Evaluated and documentation done after procedure finished. Patient identified. Risks/Benefits/Options discussed with patient including but not limited to bleeding, infection, nerve damage, paralysis, failed block, incomplete pain control, headache, blood pressure changes, nausea, vomiting, reactions to medication both or allergic, itching and postpartum back pain. Confirmed with bedside nurse the patient's most recent platelet count. Confirmed with patient that they are not currently taking any anticoagulation, have any bleeding history or any family history of bleeding disorders. Patient expressed understanding and wished to proceed. All questions were answered. Sterile technique was used throughout the entire procedure. Please see nursing notes for vital signs. Test dose was given through epidural catheter and negative prior to continuing to dose epidural or start infusion. Warning signs of  high block given to the patient including shortness of breath, tingling/numbness in hands, complete motor block, or any concerning symptoms with instructions to call for help. Patient was given instructions on fall risk and not to get out of bed. All questions and concerns addressed with instructions to call with any issues or inadequate analgesia.   Patient tolerated the insertion well without immediate complications.Reason for block:procedure for pain

## 2019-11-08 NOTE — Progress Notes (Signed)
  Labor Progress Note   34 y.o. [id number] @ [redacted]w[redacted]d , admitted for  Pregnancy, Labor Management. IOL due to fetal arrhythmia at term  Subjective:  Moderate ctx pains after 2 doses of Cytotec No VB or ROM  Objective:  BP (!) 149/91 (BP Location: Right Arm)   Pulse 78   Temp (!) 97.5 F (36.4 C) (Oral)   Resp 17   Ht 5\' 2"  (1.575 m)   Wt 102.5 kg   LMP 02/20/2019   BMI 41.34 kg/m  Abd: gravid, ND, FHT present, moderate tenderness on exam Extr: trace to 1+ bilateral pedal edema SVE: last exam 2/50/-2  EFM: FHR: 140 bpm, variability: moderate,  accelerations:  Present,  decelerations:  Absent Toco: irreg but increasing frequency Labs: I have reviewed the patient's lab results.   Assessment & Plan:  02/22/2019 @ [redacted]w[redacted]d, admitted for  Pregnancy and Labor/Delivery Management  1. Pain management: none. 2. FWB: FHT category 1.  3. ID: GBS negative 4. Labor management: No further Cytotec, Pitocin as needed based on ctx pattern and cervical change AROM when appropriate Epidural as desired  All discussed with patient, see orders  [redacted]w[redacted]d, MD, Annamarie Major Ob/Gyn, [facility] 11/08/2019  5:30 AM

## 2019-11-08 NOTE — Anesthesia Preprocedure Evaluation (Signed)
Anesthesia Evaluation  Patient identified by MRN, date of birth, ID band Patient awake    Reviewed: Allergy & Precautions, H&P , NPO status , Patient's Chart, lab work & pertinent test results  History of Anesthesia Complications Negative for: history of anesthetic complications  Airway Mallampati: III  TM Distance: <3 FB Neck ROM: full    Dental  (+) Chipped   Pulmonary neg pulmonary ROS, former smoker,           Cardiovascular Exercise Tolerance: Good hypertension, (-) angina(-) DOE + Valvular Problems/Murmurs      Neuro/Psych    GI/Hepatic negative GI ROS, GERD  ,  Endo/Other  Morbid obesity  Renal/GU   negative genitourinary   Musculoskeletal   Abdominal   Peds  Hematology negative hematology ROS (+)   Anesthesia Other Findings Past Medical History: No date: GERD (gastroesophageal reflux disease)     Comment:  OCC No date: Heart murmur     Comment:  ASYMPTOMATIC  Past Surgical History: 03/01/2017: DILATION AND EVACUATION; N/A     Comment:  Procedure: DILATATION AND EVACUATION;  Surgeon: Nadara Mustard, MD;  Location: [facility] ORS;  Service: Gynecology;               Laterality: N/A;  BMI    Body Mass Index: 41.34 kg/m      Reproductive/Obstetrics (+) Pregnancy                             Anesthesia Physical Anesthesia Plan  ASA: III  Anesthesia Plan: Epidural   Post-op Pain Management:    Induction:   PONV Risk Score and Plan:   Airway Management Planned:   Additional Equipment:   Intra-op Plan:   Post-operative Plan:   Informed Consent: I have reviewed the patients History and Physical, chart, labs and discussed the procedure including the risks, benefits and alternatives for the proposed anesthesia with the patient or authorized representative who has indicated his/her understanding and acceptance.       Plan Discussed with:  Anesthesiologist  Anesthesia Plan Comments:         Anesthesia Quick Evaluation

## 2019-11-08 NOTE — Progress Notes (Signed)
  Labor Progress Note   34 y.o. [id number] @ [redacted]w[redacted]d , admitted for  Pregnancy, Labor Management.   Subjective:  Feeling some pain in lower abdomen.  Objective:  BP 125/85 (BP Location: Right Arm)   Pulse 68   Temp 98 F (36.7 C) (Oral)   Resp 16   Ht 5\' 2"  (1.575 m)   Wt 102.5 kg   LMP 02/20/2019   SpO2 100%   BMI 41.34 kg/m  Abd: gravid, ND, FHT present, mild tenderness on exam Extr: trace to 1+ bilateral pedal edema SVE: CERVIX: 7 cm dilated, 90 effaced, -1 station  EFM: FHR: 135 bpm, variability: moderate,  accelerations:  Present,  decelerations:  Absent Toco: Frequency: Every 2-3 minutes Labs: I have reviewed the patient's lab results.   Assessment & Plan:  02/22/2019 @ [redacted]w[redacted]d, admitted for  Pregnancy and Labor/Delivery Management  1. Pain management: epidural. 2. FWB: FHT category I.  3. ID: GBS negative 4. Labor management: continue pitocin  All discussed with patient, see orders   [redacted]w[redacted]d, CNM [facility] 11/08/2019  10:02 PM

## 2019-11-09 ENCOUNTER — Encounter: Payer: Self-pay | Admitting: Obstetrics & Gynecology

## 2019-11-09 DIAGNOSIS — O99214 Obesity complicating childbirth: Secondary | ICD-10-CM

## 2019-11-09 LAB — COMPREHENSIVE METABOLIC PANEL
ALT: 10 U/L (ref 0–44)
AST: 17 U/L (ref 15–41)
Albumin: 2.2 g/dL — ABNORMAL LOW (ref 3.5–5.0)
Alkaline Phosphatase: 127 U/L — ABNORMAL HIGH (ref 38–126)
Anion gap: 8 (ref 5–15)
BUN: 7 mg/dL (ref 6–20)
CO2: 24 mmol/L (ref 22–32)
Calcium: 9.1 mg/dL (ref 8.9–10.3)
Chloride: 105 mmol/L (ref 98–111)
Creatinine, Ser: 0.69 mg/dL (ref 0.44–1.00)
GFR calc Af Amer: >60 mL/min (ref >60)
GFR calc non Af Amer: >60 mL/min (ref >60)
Glucose, Bld: 119 mg/dL — ABNORMAL HIGH (ref 70–99)
Potassium: 4.5 mmol/L (ref 3.5–5.1)
Sodium: 137 mmol/L (ref 135–145)
Total Bilirubin: 0.5 mg/dL (ref 0.3–1.2)
Total Protein: 5.2 g/dL — ABNORMAL LOW (ref 6.5–8.1)

## 2019-11-09 LAB — FETAL SCREEN: Fetal Screen: NEGATIVE

## 2019-11-09 LAB — CBC
HCT: 33 % — ABNORMAL LOW (ref 36.0–46.0)
Hemoglobin: 10.7 g/dL — ABNORMAL LOW (ref 12.0–15.0)
MCH: 28.5 pg (ref 26.0–34.0)
MCHC: 32.4 g/dL (ref 30.0–36.0)
MCV: 88 fL (ref 80.0–100.0)
Platelets: 154 10*3/uL (ref 150–400)
RBC: 3.75 MIL/uL — ABNORMAL LOW (ref 3.87–5.11)
RDW: 13 % (ref 11.5–15.5)
WBC: 17.7 10*3/uL — ABNORMAL HIGH (ref 4.0–10.5)
nRBC: 0 % (ref 0.0–0.2)

## 2019-11-09 LAB — PROTEIN / CREATININE RATIO, URINE
Creatinine, Urine: 76 mg/dL
Protein Creatinine Ratio: 0.3 mg/mg{Cre} — ABNORMAL HIGH (ref 0.00–0.15)
Total Protein, Urine: 23 mg/dL

## 2019-11-09 MED ORDER — ONDANSETRON HCL 4 MG PO TABS: 4.0000 mg | ORAL_TABLET | ORAL | Status: DC | PRN

## 2019-11-09 MED ORDER — DIPHENHYDRAMINE HCL 25 MG PO CAPS: 25.0000 mg | ORAL_CAPSULE | Freq: Four times a day (QID) | ORAL | Status: DC | PRN

## 2019-11-09 MED ORDER — RHO D IMMUNE GLOBULIN 1500 UNIT/2ML IJ SOSY
300.0000 ug | PREFILLED_SYRINGE | Freq: Once | INTRAMUSCULAR | Status: AC
  Administered 2019-11-09: 300 ug via INTRAVENOUS
  Filled 2019-11-09: qty 2

## 2019-11-09 MED ORDER — DIBUCAINE (PERIANAL) 1 % EX OINT: 1.0000 "application " | TOPICAL_OINTMENT | CUTANEOUS | Status: DC | PRN

## 2019-11-09 MED ORDER — PRENATAL MULTIVITAMIN CH
1.0000 | ORAL_TABLET | Freq: Every day | ORAL | Status: DC
  Administered 2019-11-09: 1 via ORAL
  Filled 2019-11-09: qty 1

## 2019-11-09 MED ORDER — WITCH HAZEL-GLYCERIN EX PADS: 1.0000 "application " | MEDICATED_PAD | CUTANEOUS | Status: DC | PRN

## 2019-11-09 MED ORDER — ONDANSETRON HCL 4 MG/2ML IJ SOLN: 4.0000 mg | INTRAMUSCULAR | Status: DC | PRN

## 2019-11-09 MED ORDER — ACETAMINOPHEN 325 MG PO TABS: 650.0000 mg | ORAL_TABLET | ORAL | Status: DC | PRN

## 2019-11-09 MED ORDER — IBUPROFEN 600 MG PO TABS
600.0000 mg | ORAL_TABLET | Freq: Four times a day (QID) | ORAL | Status: DC
  Administered 2019-11-09 – 2019-11-10 (×5): 600 mg via ORAL
  Filled 2019-11-09 (×5): qty 1

## 2019-11-09 MED ORDER — SIMETHICONE 80 MG PO CHEW: 80.0000 mg | CHEWABLE_TABLET | ORAL | Status: DC | PRN

## 2019-11-09 MED ORDER — COCONUT OIL OIL
1.0000 "application " | TOPICAL_OIL | Status: DC | PRN
  Administered 2019-11-10: 1 via TOPICAL
  Filled 2019-11-09: qty 120

## 2019-11-09 MED ORDER — SENNOSIDES-DOCUSATE SODIUM 8.6-50 MG PO TABS: 2.0000 | ORAL_TABLET | ORAL | Status: DC

## 2019-11-09 MED ORDER — BENZOCAINE-MENTHOL 20-0.5 % EX AERO: 1.0000 "application " | INHALATION_SPRAY | CUTANEOUS | Status: DC | PRN

## 2019-11-09 NOTE — Plan of Care (Signed)
Transferred to room 346. Oriented to room, Safety and Security, Fall Prevention and POC. Pt. V/O.

## 2019-11-09 NOTE — Discharge Summary (Signed)
OB Discharge Summary     Patient Name: Penny Jones DOB: April 28, 1986 MRN: [id number]  Date of admission: 11/07/2019 Delivering provider: Rod Can, CNM  Date of Delivery: 11/09/2019  Date of discharge: 11/10/2019  Admitting diagnosis: Fetal arrhythmia before the onset of labor [P03.810] Intrauterine pregnancy: [redacted]w[redacted]d    Secondary diagnosis: Gestational hypertension     Discharge diagnosis: Term Pregnancy Delivered  , gestational hypertension                                                                                             Post partum procedures:rhogam Mother A negative and baby A positive Augmentation: Pitocin and Cytotec  Complications: None  Hospital course:  Induction of Labor With Vaginal Delivery   34y.o. yo [id number] [id number] admitted to the hospital 11/07/2019 for induction of labor.  Indication for induction: fetal arrhythmia.and gestational hypertension  Patient had an uncomplicated labor course as follows: Membrane Rupture Time/Date: 3:00 PM ,11/08/2019   Patient had delivery of viable female 12:27 AM, 11/09/2019 Details of delivery can be found in separate delivery note.    Patient had a postpartum course remarkable for mild range to normal blood pressures. She received her RHogam on 11/09/2019.  On PPD#1 she requested discharge. Having met her discharge goals she was deemed stable for discharge. Advised to follow up in 3 days for a blood pressure check.  Patient is discharged home 11/10/19.  Physical exam  Vitals:   11/09/19 2055 11/09/19 2345 11/10/19 0300 11/10/19 0736  BP: 132/78 138/90 135/85 133/89  Pulse: 79 77 78 78  Resp: '20 20 18 18  ' Temp: 98 F (36.7 C) 98.1 F (36.7 C) 98 F (36.7 C) 98.2 F (36.8 C)  TempSrc: Oral Oral Oral Oral  SpO2: 98% 100% 99% 98%  Weight:      Height:       General: alert, cooperative and no distress Lochia: appropriate Uterine Fundus: firm Incision: N/A DVT Evaluation: No evidence of DVT seen on physical  exam.  Labs: Lab Results  Component Value Date   WBC 17.7 (H) 11/09/2019   HGB 10.7 (L) 11/09/2019   HCT 33.0 (L) 11/09/2019   MCV 88.0 11/09/2019   PLT 154 11/09/2019    Discharge instruction: per After Visit Summary.  Medications:  Allergies as of 11/10/2019      Reactions   Pollen Extract       Medication List    TAKE these medications   acetaminophen 500 MG tablet Commonly known as: TYLENOL Take 500-1,000 mg by mouth every 6 (six) hours as needed (for pain/headaches.).   calcium carbonate 500 MG chewable tablet Commonly known as: TUMS - dosed in mg elemental calcium Chew 1 tablet by mouth as needed for indigestion or heartburn.   ibuprofen 600 MG tablet Commonly known as: ADVIL Take 1 tablet (600 mg total) by mouth every 6 (six) hours as needed.   Vitafol Ultra 29-0.6-0.4-200 MG Caps Take 1 capsule by mouth daily.       Diet: routine diet  Activity: Advance as tolerated. Pelvic rest for 6 weeks.   Outpatient follow up: Follow-up  Information    Rod Can, CNM. Schedule an appointment as soon as possible for a visit in 3 day(s).   Specialty: Obstetrics Why: for blood pressure check Contact information: [place] [address] [phone]             Postpartum contraception: IUD Mirena vs Paragard Rhogam Given postpartum: yes Rubella vaccine given postpartum: Immune Varicella vaccine given postpartum: Immune TDaP given antepartum or postpartum: given antepartum  Newborn Data: Live born female Penny Jones Birth Weight: 3570 g, 7 pounds 14 ounces APGAR: 8, 9  Newborn Delivery   Birth date/time: 11/09/2019 00:27:00 Delivery type: Vaginal, Spontaneous       Baby Feeding: Breast  Disposition: home with mother  SIGNED:  Dalia Heading, CNM 11/10/2019 9:21 AM

## 2019-11-09 NOTE — Progress Notes (Signed)
Subjective:  Doing well postpartum 8 hours day 0. She is tolerating regular diet. Her pain is controlled with PO medications. She is ambulating with assistance and voiding without difficulty. She reports breastfeeding is going well. She denies headache, visual changes or epigastric pain.   Objective:  Vital signs in last 24 hours: Temp:  [97.6 F (36.4 C)-98.5 F (36.9 C)] 97.6 F (36.4 C) (01/17 0748) Pulse Rate:  [64-119] 71 (01/17 0748) Resp:  [14-18] 18 (01/17 0748) BP: (86-157)/(63-109) 146/92 (01/17 0748) SpO2:  [94 %-100 %] 98 % (01/17 0748)    General: NAD Pulmonary: no increased work of breathing Abdomen: non-distended, non-tender, fundus firm at level of umbilicus Extremities: no edema, no erythema, no tenderness  Results for orders placed or performed during the hospital encounter of 11/07/19 (from the past 72 hour(s))  Respiratory Panel by RT PCR (Flu A&B, Covid) - Nasopharyngeal Swab     Status: None   Collection Time: 11/07/19  9:33 PM   Specimen: Nasopharyngeal Swab  Result Value Ref Range   SARS Coronavirus 2 by RT PCR NEGATIVE NEGATIVE    Comment: (NOTE) SARS-CoV-2 target nucleic acids are NOT DETECTED. The SARS-CoV-2 RNA is generally detectable in upper respiratoy specimens during the acute phase of infection. The lowest concentration of SARS-CoV-2 viral copies this assay can detect is 131 copies/mL. A negative result does not preclude SARS-Cov-2 infection and should not be used as the sole basis for treatment or other patient management decisions. A negative result may occur with  improper specimen collection/handling, submission of specimen other than nasopharyngeal swab, presence of viral mutation(s) within the areas targeted by this assay, and inadequate number of viral copies (<131 copies/mL). A negative result must be combined with clinical observations, patient history, and epidemiological information. The expected result is Negative. Fact Sheet  for Patients:  https://www.moore.com/ Fact Sheet for Healthcare Providers:  https://www.young.biz/ This test is not yet ap proved or cleared by the [place] FDA and  has been authorized for detection and/or diagnosis of SARS-CoV-2 by FDA under an Emergency Use Authorization (EUA). This EUA will remain  in effect (meaning this test can be used) for the duration of the COVID-19 declaration under Section 564(b)(1) of the Act, 21 U.S.C. section 360bbb-3(b)(1), unless the authorization is terminated or revoked sooner.    Influenza A by PCR NEGATIVE NEGATIVE   Influenza B by PCR NEGATIVE NEGATIVE    Comment: (NOTE) The Xpert Xpress SARS-CoV-2/FLU/RSV assay is intended as an aid in  the diagnosis of influenza from Nasopharyngeal swab specimens and  should not be used as a sole basis for treatment. Nasal washings and  aspirates are unacceptable for Xpert Xpress SARS-CoV-2/FLU/RSV  testing. Fact Sheet for Patients: https://www.moore.com/ Fact Sheet for Healthcare Providers: https://www.young.biz/ This test is not yet approved or cleared by the [place] FDA and  has been authorized for detection and/or diagnosis of SARS-CoV-2 by  FDA under an Emergency Use Authorization (EUA). This EUA will remain  in effect (meaning this test can be used) for the duration of the  Covid-19 declaration under Section 564(b)(1) of the Act, 21  U.S.C. section 360bbb-3(b)(1), unless the authorization is  terminated or revoked. Performed at [facility], [address] [place], [place] [address]   CBC     Status: Abnormal   Collection Time: 11/07/19 10:07 PM  Result Value Ref Range   WBC 12.2 (H) 4.0 - 10.5 K/uL   RBC 4.07 3.87 - 5.11 MIL/uL   Hemoglobin 11.5 (L)  12.0 - 15.0 g/dL   HCT 34.6 (L) 36.0 - 46.0 %   MCV 85.0 80.0 - 100.0 fL   MCH 28.3 26.0 - 34.0 pg   MCHC 33.2 30.0 - 36.0 g/dL   RDW 12.9 11.5 -  15.5 %   Platelets 182 150 - 400 K/uL   nRBC 0.0 0.0 - 0.2 %    Comment: Performed at [facility], [address] [place], [place] [address]  RPR     Status: None   Collection Time: 11/07/19 10:07 PM  Result Value Ref Range   RPR Ser Ql NON REACTIVE NON REACTIVE    Comment: Performed at [facility] [address], [place], [place] [address]  Type and screen     Status: None   Collection Time: 11/07/19 10:07 PM  Result Value Ref Range   ABO/RH(D) A NEG    Antibody Screen POS    Sample Expiration 11/10/2019,2359    Antibody Identification PASSIVELY ACQUIRED ANTI-D    Unit Number [id number]    Blood Component Type RED CELLS,LR    Unit division 00    Status of Unit REL FROM [facility]    Transfusion Status OK TO TRANSFUSE    Crossmatch Result COMPATIBLE    Unit Number [id number]    Blood Component Type RED CELLS,LR    Unit division 00    Status of Unit REL FROM [facility]    Transfusion Status OK TO TRANSFUSE    Crossmatch Result COMPATIBLE   CBC     Status: Abnormal   Collection Time: 11/09/19  5:58 AM  Result Value Ref Range   WBC 17.7 (H) 4.0 - 10.5 K/uL   RBC 3.75 (L) 3.87 - 5.11 MIL/uL   Hemoglobin 10.7 (L) 12.0 - 15.0 g/dL   HCT 33.0 (L) 36.0 - 46.0 %   MCV 88.0 80.0 - 100.0 fL   MCH 28.5 26.0 - 34.0 pg   MCHC 32.4 30.0 - 36.0 g/dL   RDW 13.0 11.5 - 15.5 %   Platelets 154 150 - 400 K/uL   nRBC 0.0 0.0 - 0.2 %    Comment: Performed at [facility], [address] [place], [place] [address]  Comprehensive metabolic panel     Status: Abnormal   Collection Time: 11/09/19  6:01 AM  Result Value Ref Range   Sodium 137 135 - 145 mmol/L   Potassium 4.5 3.5 - 5.1 mmol/L   Chloride 105 98 - 111 mmol/L   CO2 24 22 - 32 mmol/L   Glucose, Bld 119 (H) 70 - 99 mg/dL   BUN 7 6 - 20 mg/dL   Creatinine, Ser 0.69 0.44 - 1.00 mg/dL   Calcium 9.1 8.9 - 10.3 mg/dL   Total Protein 5.2 (L) 6.5 - 8.1 g/dL   Albumin 2.2 (L) 3.5 - 5.0 g/dL   AST 17 15 - 41 U/L    ALT 10 0 - 44 U/L   Alkaline Phosphatase 127 (H) 38 - 126 U/L   Total Bilirubin 0.5 0.3 - 1.2 mg/dL   GFR calc non Af Amer >60 >60 mL/min   GFR calc Af Amer >60 >60 mL/min   Anion gap 8 5 - 15    Comment: Performed at [facility], [address] [place], [place] [address]    Assessment:   34 y.o. [id number] postpartum day # 0, lactating  Plan:    1) Acute blood loss anemia - hemodynamically stable and asymptomatic - po ferrous sulfate  2) Elevated blood pressure: repeat PIH labs  3) Blood Type --/--/A NEG (01/15 2207) / Rubella 2.23 (06/08 1507) / Varicella Immune  4) TDAP status up to date  5) Feeding plan breast  6)  Education given regarding options for contraception, as well as compatibility with breast feeding if applicable.  Patient plans on IUD for contraception. Mirena vs Paragard- will notify office of desired method prior to 6 week visit.   7) Disposition: continue routine care   Tresea Mall, CNM [facility] 11/09/2019, 9:03 AM

## 2019-11-09 NOTE — Lactation Note (Addendum)
This note was copied from a baby's chart. Lactation Consultation Note  Patient Name: Penny Jones TMLYY'T Date: 11/09/2019 Reason for consult: Follow-up assessment;Mother's request;Early term 70-38.6wks  Mom requested lactation assistance.  Mom concerned that even after Lauris Poag had fed on left breast for 20 minutes she continued to root and remain a little fussy.  Explained feeding cues and need to put her to the breast whenever she demonstrated hunger cues.  Demonstrated hand expression of colostrum.  Encouraged to rub colostrum on nipples after breast feeding to prevent bacteria, lubricate and prevent soreness.  Assisted mom with pillow support and comfortable position with [name] in football hold on right breast which is the position mom is more comfortable with for now.  She latched with wide open mouth and flanged lips.  She immediately began strong, rhythmic sucking with occasional swallows.  Mom denies any breast or nipple pain.  Mom reports breast feeding first daughter for 6 months, but really wants to breast feed longer with Lauris Poag.  Mom has ordered Doylene Canning DEBP through her [employer] benefits.  Demonstrated how to massage breast and gently stimulate [name] to keep her actively sucking while at the breast.  Reviewed normal newborn stomach size, supply and demand, normal course of lactation and routine newborn feeding patterns.  Lactation community resource hand out with contact numbers, support groups and websites given and reviewed.  Lactation name and number written on white board and encouraged to call with any questions, concerns or assistance.    Maternal Data Formula Feeding for Exclusion: No Has patient been taught Hand Expression?: Yes Does the patient have breastfeeding experience prior to this delivery?: Yes  Feeding Feeding Type: Breast Fed  LATCH Score Latch: Grasps breast easily, tongue down, lips flanged, rhythmical sucking.  Audible Swallowing: A few with  stimulation  Type of Nipple: Everted at rest and after stimulation  Comfort (Breast/Nipple): Soft / non-tender  Hold (Positioning): No assistance needed to correctly position infant at breast.  LATCH Score: 9  Interventions Interventions: Skin to skin;Breast massage;Breast compression;Adjust position;Support pillows;Position options  Lactation Tools Discussed/Used WIC Program: No([employer] military benefit)   Consult Status Consult Status: PRN Follow-up type: Call as needed    Louis Meckel 11/09/2019, 8:14 PM

## 2019-11-10 ENCOUNTER — Encounter: Payer: Self-pay | Admitting: Obstetrics & Gynecology

## 2019-11-10 DIAGNOSIS — O139 Gestational [pregnancy-induced] hypertension without significant proteinuria, unspecified trimester: Secondary | ICD-10-CM | POA: Diagnosis present

## 2019-11-10 LAB — RHOGAM INJECTION: Unit division: 0

## 2019-11-10 LAB — TYPE AND SCREEN
ABO/RH(D): A NEG
Antibody Screen: POSITIVE
Unit division: 0
Unit division: 0

## 2019-11-10 LAB — BPAM RBC
Blood Product Expiration Date: 202102102359
Blood Product Expiration Date: 202102102359
Unit Type and Rh: 600
Unit Type and Rh: 600

## 2019-11-10 MED ORDER — IBUPROFEN 600 MG PO TABS: 600.0000 mg | ORAL_TABLET | Freq: Four times a day (QID) | ORAL | 0 refills | Status: AC | PRN

## 2019-11-10 NOTE — Progress Notes (Signed)
DC inst reviewed with pt.  Pt and husband eagerly await dc this morning, been to Nursing station several times.  Pt verbalizes u/o of fu appt on Thurs for BP check at [facility].

## 2019-11-10 NOTE — Lactation Note (Signed)
This note was copied from a baby's chart. Lactation Consultation Note  Patient Name: Penny Jones WGNFA'O Date: 11/10/2019 Reason for consult: Follow-up assessment;Early term 37-38.6wks;Nipple pain/trauma(Left nipple has broken area in center of nipple)  Observed mom breast feeding independently with Lauris Poag latching well with strong, rhythmic sucking and occasional swallows for 20 minutes in football hold on right breast.  When hand expressing from left breast, it was noted that mom had broken area in center of that nipple, but no trauma to right nipple.  Mom only reports slight nipple tenderness.  Hand expressed small amount of colostrum and encouraged mom to rub on nipples after breast feed explaining value of colostrum and breast milk to healing process.  Coconut oil and comfort gels given and instructed in alternating use.  Praised mom for her commitment to continue to breast feed [name]. Lactation community resource hand out with contact numbers given and reviewed encouraging mom to call with any questions, concerns or assistance.  Maternal Data Formula Feeding for Exclusion: No Has patient been taught Hand Expression?: Yes Does the patient have breastfeeding experience prior to this delivery?: Yes  Feeding Feeding Type: Breast Fed  LATCH Score Latch: Grasps breast easily, tongue down, lips flanged, rhythmical sucking.  Audible Swallowing: A few with stimulation  Type of Nipple: Everted at rest and after stimulation  Comfort (Breast/Nipple): Filling, red/small blisters or bruises, mild/mod discomfort  Hold (Positioning): No assistance needed to correctly position infant at breast.  LATCH Score: 8  Interventions Interventions: Breast massage;Breast compression;Support pillows;Coconut oil;Comfort gels  Lactation Tools Discussed/Used Tools: Coconut oil;Comfort gels WIC Program: [employer] benefit)   Consult Status Consult Status: PRN Follow-up type: Call as  needed    Louis Meckel 11/10/2019, 11:53 AM

## 2019-11-10 NOTE — Anesthesia Postprocedure Evaluation (Signed)
Anesthesia Post Note  Patient: Penny Jones  Procedure(s) Performed: AN AD HOC LABOR EPIDURAL  Patient location during evaluation: Mother Baby Anesthesia Type: Epidural Level of consciousness: awake and alert Pain management: pain level controlled Vital Signs Assessment: post-procedure vital signs reviewed and stable Respiratory status: spontaneous breathing, nonlabored ventilation and respiratory function stable Cardiovascular status: stable Postop Assessment: no headache, no backache and epidural receding Anesthetic complications: no     Last Vitals:  Vitals:   11/10/19 0300 11/10/19 0736  BP: 135/85 133/89  Pulse: 78 78  Resp: 18 18  Temp: 36.7 C 36.8 C  SpO2: 99% 98%    Last Pain:  Vitals:   11/10/19 0900  TempSrc:   PainSc: 0-No pain                 Neco Kling Lawerance Cruel

## 2019-11-10 NOTE — Progress Notes (Signed)
DC to home. To car via wheelchair escorted out by Nursing staff.

## 2019-11-13 ENCOUNTER — Other Ambulatory Visit: Payer: Self-pay

## 2019-11-13 ENCOUNTER — Encounter: Payer: Self-pay | Admitting: Advanced Practice Midwife

## 2019-11-13 ENCOUNTER — Ambulatory Visit (INDEPENDENT_AMBULATORY_CARE_PROVIDER_SITE_OTHER): Admitting: Advanced Practice Midwife

## 2019-11-13 VITALS — BP 144/90 | Ht 62.0 in | Wt 217.0 lb

## 2019-11-13 DIAGNOSIS — Z0130 Encounter for examination of blood pressure without abnormal findings: Secondary | ICD-10-CM

## 2019-11-13 NOTE — Progress Notes (Signed)
Obstetrics & Gynecology Office Visit   Chief Complaint:  Chief Complaint  Patient presents with  . Postpartum Care  . Blood Pressure Check    History of Present Illness: 34 y.o. [id number] being seen for follow up blood pressure check today.  The patient is 4 days postpartum. The established diagnosis for the patient is gestational hypertension.  She is currently on no antihypertensives.  She reports no current symptoms attributable to her blood pressure.  Medication list reviewed medications which may contribute to BP elevation were not noted.  Review of Systems:  Review of Systems  Constitutional: Negative.   HENT: Negative.   Eyes: Negative.   Respiratory: Negative.   Cardiovascular: Negative.   Gastrointestinal: Negative.   Genitourinary:       Soreness from delivery  Musculoskeletal: Negative.   Skin: Negative.   Neurological: Negative.   Endo/Heme/Allergies: Negative.   Psychiatric/Behavioral: Negative.      Past Medical History:  Past Medical History:  Diagnosis Date  . GERD (gastroesophageal reflux disease)    OCC  . Heart murmur    ASYMPTOMATIC    Past Surgical History:  Past Surgical History:  Procedure Laterality Date  . DILATION AND EVACUATION N/A 03/01/2017   Procedure: DILATATION AND EVACUATION;  Surgeon: Nadara Mustard, MD;  Location: [facility] ORS;  Service: Gynecology;  Laterality: N/A;    Gynecologic History: Patient's last menstrual period was 02/20/2019.  Obstetric History: [id number]  Family History:  Family History  Problem Relation Age of Onset  . Hypertension Mother     Social History:  Social History   Socioeconomic History  . Marital status: Married    Spouse name: Not on file  . Number of children: Not on file  . Years of education: Not on file  . Highest education level: Not on file  Occupational History  . Not on file  Tobacco Use  . Smoking status: Former Games developer  . Smokeless tobacco: Never Used  Substance and Sexual  Activity  . Alcohol use: Not Currently  . Drug use: No  . Sexual activity: Yes    Birth control/protection: I.U.D.  Other Topics Concern  . Not on file  Social History Narrative  . Not on file   Social Determinants of Health   Financial Resource Strain:   . Difficulty of Paying Living Expenses: Not on file  Food Insecurity:   . Worried About Programme researcher, broadcasting/film/video in the Last Year: Not on file  . Ran Out of Food in the Last Year: Not on file  Transportation Needs:   . Lack of Transportation (Medical): Not on file  . Lack of Transportation (Non-Medical): Not on file  Physical Activity:   . Days of Exercise per Week: Not on file  . Minutes of Exercise per Session: Not on file  Stress:   . Feeling of Stress : Not on file  Social Connections:   . Frequency of Communication with Friends and Family: Not on file  . Frequency of Social Gatherings with Friends and Family: Not on file  . Attends Religious Services: Not on file  . Active Member of Clubs or Organizations: Not on file  . Attends Banker Meetings: Not on file  . Marital Status: Not on file  Intimate Partner Violence:   . Fear of Current or Ex-Partner: Not on file  . Emotionally Abused: Not on file  . Physically Abused: Not on file  . Sexually Abused: Not on file    Allergies:  Allergies  Allergen Reactions  . Pollen Extract     Medications: Prior to Admission medications   Medication Sig Start Date End Date Taking? Authorizing Provider  acetaminophen (TYLENOL) 500 MG tablet Take 500-1,000 mg by mouth every 6 (six) hours as needed (for pain/headaches.).    [provider]  calcium carbonate (TUMS - DOSED IN MG ELEMENTAL CALCIUM) 500 MG chewable tablet Chew 1 tablet by mouth as needed for indigestion or heartburn.    [provider]  ibuprofen (ADVIL) 600 MG tablet Take 1 tablet (600 mg total) by mouth every 6 (six) hours as needed. Patient not taking: Reported on 11/13/2019 11/10/19    Dalia Heading, CNM  Prenat-Fe Poly-Methfol-FA-DHA (VITAFOL ULTRA) 29-0.6-0.4-200 MG CAPS Take 1 capsule by mouth daily. 03/31/19   Gae Dry, MD    Physical Exam Blood pressure (!) 144/90, height 5\' 2"  (1.575 m), weight 217 lb (98.4 kg), last menstrual period 02/20/2019, currently breastfeeding.  BP recheck 140/88  Patient's last menstrual period was 02/20/2019.   General: NAD HEENT: normocephalic, anicteric Pulmonary: No increased work of breathing Cardiovascular: RRR, distal pulses 2+ Extremities: no edema, no erythema, no tenderness Neurologic: Grossly intact Psychiatric: mood appropriate, affect full  Assessment: 34 y.o. Q6P6195 presenting for blood pressure evaluation today  Plan: Problem List Items Addressed This Visit    None      1) Blood pressure - blood pressure at today's visit is mildly elevated.  We discussed return to clinic for evaluation with a low threshold for any s/s of HTN. Patient has no history of HTN and prefers to not take medication.  Check BP at home and return for consistently mild elevation of BP or any severe elevation. Return to clinic in 1 week for another BP check - additional blood work was not obtained  Rod Can, [place] 11/13/2019, 10:33 AM

## 2019-11-21 ENCOUNTER — Encounter: Payer: Self-pay | Admitting: Advanced Practice Midwife

## 2019-11-21 ENCOUNTER — Other Ambulatory Visit: Payer: Self-pay

## 2019-11-21 ENCOUNTER — Telehealth: Payer: Self-pay | Admitting: Advanced Practice Midwife

## 2019-11-21 ENCOUNTER — Ambulatory Visit (INDEPENDENT_AMBULATORY_CARE_PROVIDER_SITE_OTHER): Admitting: Advanced Practice Midwife

## 2019-11-21 VITALS — BP 122/82 | Ht 62.0 in | Wt 205.0 lb

## 2019-11-21 DIAGNOSIS — Z0130 Encounter for examination of blood pressure without abnormal findings: Secondary | ICD-10-CM

## 2019-11-21 NOTE — Progress Notes (Signed)
Obstetrics & Gynecology Office Visit   Chief Complaint:  Chief Complaint  Patient presents with  . Postpartum Care    History of Present Illness: 34 y.o. [id number] being seen for follow up blood pressure check today.  The patient is 2 weeks postpartum. The established diagnosis for the patient is gestational hypertension.  She is currently on no antihypertensives.  She reports no current symptoms attributable to her blood pressure.  Medication list reviewed no medications contraindicated for use in patient with current hypertension were noted.  Review of Systems:  Review of Systems  Constitutional: Negative.   HENT: Negative.   Eyes: Negative.   Respiratory: Negative.   Cardiovascular: Negative.   Gastrointestinal: Negative.   Genitourinary: Negative.   Musculoskeletal: Negative.   Skin: Negative.   Neurological: Negative.   Endo/Heme/Allergies: Negative.   Psychiatric/Behavioral: Negative.      Past Medical History:  Past Medical History:  Diagnosis Date  . GERD (gastroesophageal reflux disease)    OCC  . Heart murmur    ASYMPTOMATIC    Past Surgical History:  Past Surgical History:  Procedure Laterality Date  . DILATION AND EVACUATION N/A 03/01/2017   Procedure: DILATATION AND EVACUATION;  Surgeon: Gae Dry, MD;  Location: [facility] ORS;  Service: Gynecology;  Laterality: N/A;    Gynecologic History: Patient's last menstrual period was 02/20/2019.  Obstetric History: [id number]  Family History:  Family History  Problem Relation Age of Onset  . Hypertension Mother     Social History:  Social History   Socioeconomic History  . Marital status: Married    Spouse name: Not on file  . Number of children: Not on file  . Years of education: Not on file  . Highest education level: Not on file  Occupational History  . Not on file  Tobacco Use  . Smoking status: Former Research scientist (life sciences)  . Smokeless tobacco: Never Used  Substance and Sexual Activity  . Alcohol  use: Not Currently  . Drug use: No  . Sexual activity: Yes    Birth control/protection: I.U.D.  Other Topics Concern  . Not on file  Social History Narrative  . Not on file   Social Determinants of Health   Financial Resource Strain:   . Difficulty of Paying Living Expenses: Not on file  Food Insecurity:   . Worried About Charity fundraiser in the Last Year: Not on file  . Ran Out of Food in the Last Year: Not on file  Transportation Needs:   . Lack of Transportation (Medical): Not on file  . Lack of Transportation (Non-Medical): Not on file  Physical Activity:   . Days of Exercise per Week: Not on file  . Minutes of Exercise per Session: Not on file  Stress:   . Feeling of Stress : Not on file  Social Connections:   . Frequency of Communication with Friends and Family: Not on file  . Frequency of Social Gatherings with Friends and Family: Not on file  . Attends Religious Services: Not on file  . Active Member of Clubs or Organizations: Not on file  . Attends Archivist Meetings: Not on file  . Marital Status: Not on file  Intimate Partner Violence:   . Fear of Current or Ex-Partner: Not on file  . Emotionally Abused: Not on file  . Physically Abused: Not on file  . Sexually Abused: Not on file    Allergies:  Allergies  Allergen Reactions  . Bee Pollen  Other reaction(s): Other (See Comments)    Medications: Prior to Admission medications   Medication Sig Start Date End Date Taking? Authorizing Provider  acetaminophen (TYLENOL) 500 MG tablet Take 500-1,000 mg by mouth every 6 (six) hours as needed (for pain/headaches.).   Yes [provider]  calcium carbonate (TUMS - DOSED IN MG ELEMENTAL CALCIUM) 500 MG chewable tablet Chew 1 tablet by mouth as needed for indigestion or heartburn.   Yes [provider]  ibuprofen (ADVIL) 600 MG tablet Take 1 tablet (600 mg total) by mouth every 6 (six) hours as needed. 11/10/19  Yes Farrel Conners, CNM  Prenat-Fe Poly-Methfol-FA-DHA (VITAFOL ULTRA) 29-0.6-0.4-200 MG CAPS Take 1 capsule by mouth daily. 03/31/19  Yes Nadara Mustard, MD    Physical Exam Blood pressure 122/82, height 5\' 2"  (1.575 m), weight 205 lb (93 kg), last menstrual period 02/20/2019, currently breastfeeding.  Patient's last menstrual period was 02/20/2019.  General: NAD HEENT: normocephalic, anicteric Pulmonary: No increased work of breathing Cardiovascular: RRR, distal pulses 2+ Extremities: no edema, no erythema, no tenderness Neurologic: Grossly intact Psychiatric: mood appropriate, affect full  Assessment: 34 y.o. 20 presenting for blood pressure evaluation today  Plan: Problem List Items Addressed This Visit    None    Visit Diagnoses    BP check    -  Primary      1) Blood pressure - blood pressure at today's visit is normotensive.  As a result antihypertensive therapy is currently not warranted. - additional blood work was not obtained 2) Return to clinic in 4 weeks for 6 week postpartum visit  Considering Mirena for contraception   [id number], CNM [facility] 11/21/2019, 11:16 AM

## 2019-11-21 NOTE — Telephone Encounter (Signed)
2/26 at 930 for mirena with [facility]

## 2019-11-24 NOTE — Telephone Encounter (Signed)
Noted. Will order to arrive by apt date/time. 

## 2019-12-12 NOTE — Telephone Encounter (Signed)
Mirena reserved for this patient. 

## 2019-12-19 ENCOUNTER — Other Ambulatory Visit: Payer: Self-pay

## 2019-12-19 ENCOUNTER — Encounter: Payer: Self-pay | Admitting: Advanced Practice Midwife

## 2019-12-19 ENCOUNTER — Ambulatory Visit (INDEPENDENT_AMBULATORY_CARE_PROVIDER_SITE_OTHER): Admitting: Advanced Practice Midwife

## 2019-12-19 DIAGNOSIS — Z30430 Encounter for insertion of intrauterine contraceptive device: Secondary | ICD-10-CM | POA: Diagnosis not present

## 2019-12-19 NOTE — Progress Notes (Signed)
Postpartum Visit  Chief Complaint:  Chief Complaint  Patient presents with  . Postpartum Care    History of Present Illness: Patient is a 34 y.o. [id number] presents for postpartum visit.  Review the Delivery Report for details.  Date of delivery: 11/09/2019 Type of delivery: Vaginal delivery - Vacuum or forceps assisted  no Episiotomy No.  Laceration: no  Pregnancy or labor problems:  Gestational hypertension Any problems since the delivery:  Blood pressure checks, no medication, return to normotensive by 2 weeks.  Newborn Details:  SINGLETON :  1. BabyGender female. Birth weight: 7 pounds 14 ounces Maternal Details:  Breast or formula feeding: breastfeeding/supplementing with formula Intercourse: Yes  Contraception after delivery: Mirena today Any bowel or bladder issues: No  Post partum depression/anxiety noted:  no Edinburgh Post-Partum Depression Score: 0 Date of last PAP: 2020  no abnormalities   Review of Systems: Review of Systems  Constitutional: Negative.   HENT: Negative.   Eyes: Negative.   Respiratory: Negative.   Cardiovascular: Negative.   Gastrointestinal: Negative.   Genitourinary: Negative.   Musculoskeletal: Negative.   Skin: Negative.   Neurological: Negative.   Endo/Heme/Allergies: Negative.   Psychiatric/Behavioral: Negative.     Past Medical History:  Past Medical History:  Diagnosis Date  . GERD (gastroesophageal reflux disease)    OCC  . Heart murmur    ASYMPTOMATIC    Past Surgical History:  Past Surgical History:  Procedure Laterality Date  . DILATION AND EVACUATION N/A 03/01/2017   Procedure: DILATATION AND EVACUATION;  Surgeon: Nadara Mustard, MD;  Location: [facility] ORS;  Service: Gynecology;  Laterality: N/A;    Family History:  Family History  Problem Relation Age of Onset  . Hypertension Mother     Social History:  Social History   Socioeconomic History  . Marital status: Married    Spouse name: Not on file  .  Number of children: Not on file  . Years of education: Not on file  . Highest education level: Not on file  Occupational History  . Not on file  Tobacco Use  . Smoking status: Former Games developer  . Smokeless tobacco: Never Used  Substance and Sexual Activity  . Alcohol use: Not Currently  . Drug use: No  . Sexual activity: Yes    Birth control/protection: I.U.D.  Other Topics Concern  . Not on file  Social History Narrative  . Not on file   Social Determinants of Health   Financial Resource Strain:   . Difficulty of Paying Living Expenses: Not on file  Food Insecurity:   . Worried About Programme researcher, broadcasting/film/video in the Last Year: Not on file  . Ran Out of Food in the Last Year: Not on file  Transportation Needs:   . Lack of Transportation (Medical): Not on file  . Lack of Transportation (Non-Medical): Not on file  Physical Activity:   . Days of Exercise per Week: Not on file  . Minutes of Exercise per Session: Not on file  Stress:   . Feeling of Stress : Not on file  Social Connections:   . Frequency of Communication with Friends and Family: Not on file  . Frequency of Social Gatherings with Friends and Family: Not on file  . Attends Religious Services: Not on file  . Active Member of Clubs or Organizations: Not on file  . Attends Banker Meetings: Not on file  . Marital Status: Not on file  Intimate Partner Violence:   .  Fear of Current or Ex-Partner: Not on file  . Emotionally Abused: Not on file  . Physically Abused: Not on file  . Sexually Abused: Not on file    Allergies:  Allergies  Allergen Reactions  . Bee Pollen     Other reaction(s): Other (See Comments)    Medications: Prior to Admission medications   Medication Sig Start Date End Date Taking? Authorizing Provider  acetaminophen (TYLENOL) 500 MG tablet Take 500-1,000 mg by mouth every 6 (six) hours as needed (for pain/headaches.).    [provider]  calcium carbonate (TUMS - DOSED IN MG  ELEMENTAL CALCIUM) 500 MG chewable tablet Chew 1 tablet by mouth as needed for indigestion or heartburn.    [provider]  ibuprofen (ADVIL) 600 MG tablet Take 1 tablet (600 mg total) by mouth every 6 (six) hours as needed. Patient not taking: Reported on 12/19/2019 11/10/19   Farrel Conners, CNM  Prenat-Fe Poly-Methfol-FA-DHA (VITAFOL ULTRA) 29-0.6-0.4-200 MG CAPS Take 1 capsule by mouth daily. Patient not taking: Reported on 12/19/2019 03/31/19   Nadara Mustard, MD    Physical Exam Blood pressure 124/74, height 5\' 2"  (1.575 m), weight 212 lb (96.2 kg), last menstrual period 02/20/2019, currently breastfeeding.    General: NAD HEENT: normocephalic, anicteric Pulmonary: No increased work of breathing Abdomen: NABS, soft, non-tender, non-distended.  Umbilicus without lesions.  No hepatomegaly, splenomegaly or masses palpable. No evidence of hernia. Genitourinary:  External: Normal external female genitalia.  Normal urethral meatus, normal  Bartholin's and Skene's glands.    Vagina: Normal vaginal mucosa, no evidence of prolapse.    Cervix: Grossly normal in appearance, no bleeding, no CMT  Uterus: Non-enlarged, mobile, normal contour.    Adnexa: ovaries non-enlarged, no adnexal masses  Rectal: deferred Extremities: no edema, erythema, or tenderness Neurologic: Grossly intact Psychiatric: mood appropriate, affect full    Assessment: 34 y.o. 20 presenting for 6 week postpartum visit  Plan: Problem List Items Addressed This Visit    None    Visit Diagnoses    6 weeks postpartum follow-up       Encounter for insertion of mirena IUD           1) Contraception - Education given regarding options for contraception, as well as compatibility with breast feeding if applicable.  Patient plans on Mirena IUD for contraception.  2)  Pap - ASCCP guidelines and rationale discussed. Patient opts for every 3 years screening interval. Next PAP due 2023.  3) Patient  underwent screening for postpartum depression with no signs of depression  4) Return in about 4 weeks (around 01/16/2020) for iud string check.   01/18/2020, CNM [facility] 12/19/2019, 9:54 AM       GYNECOLOGY OFFICE PROCEDURE NOTE  EMERITA BERKEMEIER is a 34 y.o. 20 here for Mirena IUD insertion. No GYN concerns.  Last pap smear was 2020 and was normal.  The patient is currently using abstinence after delivery and did have intercourse once and used pull out method for contraception. She is still breastfeeding. Her LMP is Patient's last menstrual period was 02/20/2019.02/22/2019  The indication for her IUD is contraception/cycle control.  IUD Insertion Procedure Note Patient identified, informed consent performed, consent signed.   Discussed risks of irregular bleeding, cramping, infection, malpositioning, expulsion or uterine perforation of the IUD (1:1000 placements)  which may require further procedure such as laparoscopy.  IUD while effective at preventing pregnancy do not prevent transmission of sexually transmitted diseases and use  of barrier methods for this purpose was discussed. Time out was performed.  Urine pregnancy test negative.  Speculum placed in the vagina.  Cervix visualized.  Cleaned with Betadine x 2.  Grasped posteriorly with a single tooth tenaculum.  Uterus sounded to 6 cm. IUD placed per manufacturer's recommendations.  Strings trimmed to 3 cm. Tenaculum was removed, good hemostasis noted.  Patient tolerated procedure well.   Patient was given post-procedure instructions.  She was advised to have backup contraception for one week.  Patient was also asked to check IUD strings periodically and follow up in 4-6 weeks for IUD check.  IUD insertion CPT 58300,  Skyla [id number] Mirena [id number] [place] [id number] Verdia Kuba [phone] Modifer 25, plus Modifer 79 is done during a global billing visit    Rod Can, [facility]

## 2020-01-15 ENCOUNTER — Other Ambulatory Visit: Payer: Self-pay

## 2020-01-15 ENCOUNTER — Ambulatory Visit (INDEPENDENT_AMBULATORY_CARE_PROVIDER_SITE_OTHER): Admitting: Advanced Practice Midwife

## 2020-01-15 ENCOUNTER — Encounter: Payer: Self-pay | Admitting: Advanced Practice Midwife

## 2020-01-15 VITALS — BP 122/74 | Ht 62.0 in | Wt 214.0 lb

## 2020-01-15 DIAGNOSIS — Z30431 Encounter for routine checking of intrauterine contraceptive device: Secondary | ICD-10-CM

## 2020-01-15 NOTE — Progress Notes (Signed)
       IUD String Check  Subjctive: [name] presents for IUD string check.  She had a Mirena placed 4 weeks ago.  Since placement of her IUD she has had some irregular vaginal bleeding/spotting with nothing currently.  She denies cramping or discomfort.  She has had intercourse since placement.  She has not checked the strings.  She denies any fever, chills, nausea, vomiting, or other complaints.    Objective: BP 122/74   Ht 5\' 2"  (1.575 m)   Wt 214 lb (97.1 kg)   LMP 02/20/2019   BMI 39.14 kg/m  Gen:  NAD, A&Ox3 HEENT: normocephalic, anicteric Pulmonary: no increased work of breathing Pelvic: Normal appearing external female genitalia, normal vaginal epithelium, no abnormal discharge. Normal appearing cervix.  IUD strings visible and 3 cm in length similar to at the time of placement. Psychiatric: mood appropriate, affect full Neurologic: grossly normal   Assessment: 34 y.o. year old female status post prior Mirena IUD placement 4 weeks ago, doing well.  Plan: 1.  The patient was given instructions to check her IUD strings monthly and call with any problems or concerns.  She should call for fevers, chills, abnormal vaginal discharge, pelvic pain, or other complaints. 2.  She will return for a annual exam in 1 year.  All questions answered.   20, CNM 01/15/2020 11:20 AM

## 2020-04-30 NOTE — Telephone Encounter (Signed)
Patient is scheduled work in on Thursday, 05/06/20 with [facility]

## 2020-05-06 ENCOUNTER — Other Ambulatory Visit: Payer: Self-pay

## 2020-05-06 ENCOUNTER — Ambulatory Visit (INDEPENDENT_AMBULATORY_CARE_PROVIDER_SITE_OTHER): Admitting: Obstetrics & Gynecology

## 2020-05-06 ENCOUNTER — Encounter: Payer: Self-pay | Admitting: Obstetrics & Gynecology

## 2020-05-06 VITALS — BP 120/80 | Ht 62.0 in | Wt 212.0 lb

## 2020-05-06 DIAGNOSIS — N814 Uterovaginal prolapse, unspecified: Secondary | ICD-10-CM | POA: Insufficient documentation

## 2020-05-06 NOTE — Progress Notes (Signed)
Patient is a 34 yo [id number] WF who recently had concern over vaginal pressure after straining fro BM, felt like she palpated a vag protrusion.  No bladder sx's.  Has felt better the next day w less vag pressure.  No LOU, freq, nocturia, urgency.  Pt has Mirena IUD.  Plans future pregnancy.   PMHx: She  has a past medical history of GERD (gastroesophageal reflux disease) and Heart murmur. Also,  has a past surgical history that includes Dilation and evacuation (N/A, 03/01/2017)., family history includes Hypertension in her mother.,  reports that she has quit smoking. She has never used smokeless tobacco. She reports previous alcohol use. She reports that she does not use drugs.  She has a current medication list which includes the following prescription(s): acetaminophen, calcium carbonate, ibuprofen, levonorgestrel, and vitafol ultra. Also, is allergic to bee pollen.  Review of Systems  Constitutional: Negative for chills, fever and malaise/fatigue.  HENT: Negative for congestion, sinus pain and sore throat.   Eyes: Negative for blurred vision and pain.  Respiratory: Negative for cough and wheezing.   Cardiovascular: Negative for chest pain and leg swelling.  Gastrointestinal: Negative for abdominal pain, constipation, diarrhea, heartburn, nausea and vomiting.  Genitourinary: Negative for dysuria, frequency, hematuria and urgency.  Musculoskeletal: Negative for back pain, joint pain, myalgias and neck pain.  Skin: Negative for itching and rash.  Neurological: Negative for dizziness, tremors and weakness.  Endo/Heme/Allergies: Does not bruise/bleed easily.  Psychiatric/Behavioral: Negative for depression. The patient is not nervous/anxious and does not have insomnia.     Objective: BP 120/80   Ht 5\' 2"  (1.575 m)   Wt 212 lb (96.2 kg)   LMP 05/04/2020   BMI 38.78 kg/m  Physical Exam Constitutional:      General: She is not in acute distress.    Appearance: She is well-developed.    Genitourinary:     Pelvic exam was performed with patient supine.     Vagina and uterus normal.     No vaginal erythema or bleeding.     No cervical motion tenderness, discharge, polyp or nabothian cyst.     IUD strings visualized.     Uterus is mobile.     Uterus is not enlarged.     No uterine mass detected.    Uterus is midaxial.     No right or left adnexal mass present.     Right adnexa not tender.     Left adnexa not tender.     Genitourinary Comments: Mild uterine mobility and descensus  HENT:     Head: Normocephalic and atraumatic.     Nose: Nose normal.  Abdominal:     General: There is no distension.     Palpations: Abdomen is soft.     Tenderness: There is no abdominal tenderness.  Musculoskeletal:        General: Normal range of motion.  Neurological:     Mental Status: She is alert and oriented to person, place, and time.     Cranial Nerves: No cranial nerve deficit.  Skin:    General: Skin is warm and dry.  Psychiatric:        Attention and Perception: Attention normal.        Mood and Affect: Mood and affect normal.        Speech: Speech normal.        Behavior: Behavior normal.        Thought Content: Thought content normal.  Judgment: Judgment normal.     ASSESSMENT/PLAN:   Problem List Items Addressed This Visit      Genitourinary   Uterine prolapse - Primary    No need for pessary, surgery or intervention at this time. Kegels discussed as option to improve musculature and be preventative from future pelvic floor weakening Pelvic floor rehabilitation or PT discussed as future option as well  A total of 20 minutes were spent face-to-face with the patient as well as preparation, review, communication, and documentation during this encounter.    Annamarie Major, MD, [facility], [facility] 05/06/2020  8:53 AM

## 2020-05-06 NOTE — Patient Instructions (Signed)
Thank you for choosing [facility]. As part of our ongoing efforts to improve patient experience, we would appreciate your feedback. Please fill out the short survey that you will receive by mail or MyChart. Your opinion is important to us! -Dr Jayon Matton  

## 2021-09-23 NOTE — Telephone Encounter (Signed)
Mirena rcvd/charged 12/19/2019

## 2022-10-19 ENCOUNTER — Other Ambulatory Visit: Payer: Self-pay | Admitting: Physician Assistant

## 2022-10-19 ENCOUNTER — Ambulatory Visit
Admission: RE | Admit: 2022-10-19 | Discharge: 2022-10-19 | Disposition: A | Discharge status: Home / Self Care | Payer: BC Managed Care – PPO | Source: Ambulatory Visit | Attending: Physician Assistant | Admitting: Physician Assistant

## 2022-10-19 DIAGNOSIS — R1011 Right upper quadrant pain: Secondary | ICD-10-CM | POA: Insufficient documentation
